# Patient Record
Sex: Male | Born: 1945 | Race: White | Hispanic: No | Marital: Married | Smoking: Former smoker
Health system: Southern US, Community
[De-identification: ages and names within clinical notes are randomized; demographics above are authoritative.]

## PROBLEM LIST (undated history)

## (undated) DIAGNOSIS — F32A Depression, unspecified: Secondary | ICD-10-CM

## (undated) DIAGNOSIS — I25811 Atherosclerosis of native coronary artery of transplanted heart without angina pectoris: Secondary | ICD-10-CM

## (undated) DIAGNOSIS — I4891 Unspecified atrial fibrillation: Secondary | ICD-10-CM

## (undated) DIAGNOSIS — G4700 Insomnia, unspecified: Secondary | ICD-10-CM

## (undated) DIAGNOSIS — C4490 Unspecified malignant neoplasm of skin, unspecified: Secondary | ICD-10-CM

## (undated) DIAGNOSIS — N183 Chronic kidney disease, stage 3 unspecified: Secondary | ICD-10-CM

## (undated) DIAGNOSIS — F329 Major depressive disorder, single episode, unspecified: Secondary | ICD-10-CM

## (undated) DIAGNOSIS — I509 Heart failure, unspecified: Secondary | ICD-10-CM

## (undated) DIAGNOSIS — F419 Anxiety disorder, unspecified: Secondary | ICD-10-CM

## (undated) DIAGNOSIS — Z941 Heart transplant status: Secondary | ICD-10-CM

## (undated) DIAGNOSIS — I10 Essential (primary) hypertension: Secondary | ICD-10-CM

## (undated) DIAGNOSIS — I639 Cerebral infarction, unspecified: Secondary | ICD-10-CM

## (undated) DIAGNOSIS — E785 Hyperlipidemia, unspecified: Secondary | ICD-10-CM

## (undated) DIAGNOSIS — L0390 Cellulitis, unspecified: Secondary | ICD-10-CM

## (undated) HISTORY — DX: Anxiety disorder, unspecified: F41.9

## (undated) HISTORY — DX: Major depressive disorder, single episode, unspecified: F32.9

## (undated) HISTORY — DX: Cellulitis, unspecified: L03.90

## (undated) HISTORY — DX: Essential (primary) hypertension: I10

## (undated) HISTORY — DX: Depression, unspecified: F32.A

---

## 2000-11-17 ENCOUNTER — Encounter: Payer: Self-pay | Admitting: Internal Medicine

## 2000-11-17 ENCOUNTER — Ambulatory Visit (HOSPITAL_COMMUNITY): Admission: RE | Admit: 2000-11-17 | Discharge: 2000-11-17 | Payer: Self-pay | Admitting: Internal Medicine

## 2001-04-25 ENCOUNTER — Ambulatory Visit (HOSPITAL_COMMUNITY): Admission: RE | Admit: 2001-04-25 | Discharge: 2001-04-27 | Payer: Self-pay | Admitting: Internal Medicine

## 2001-04-25 ENCOUNTER — Encounter: Payer: Self-pay | Admitting: Internal Medicine

## 2001-04-27 ENCOUNTER — Encounter: Payer: Self-pay | Admitting: Internal Medicine

## 2001-06-20 ENCOUNTER — Encounter (HOSPITAL_COMMUNITY): Admission: RE | Admit: 2001-06-20 | Discharge: 2001-09-18 | Payer: Self-pay | Admitting: Cardiovascular Disease

## 2002-10-03 ENCOUNTER — Inpatient Hospital Stay (HOSPITAL_COMMUNITY): Admission: EM | Admit: 2002-10-03 | Discharge: 2002-10-07 | Payer: Self-pay | Admitting: Orthopedic Surgery

## 2002-10-04 ENCOUNTER — Encounter: Payer: Self-pay | Admitting: Orthopedic Surgery

## 2003-08-30 ENCOUNTER — Ambulatory Visit (HOSPITAL_COMMUNITY): Admission: RE | Admit: 2003-08-30 | Discharge: 2003-08-30 | Payer: Self-pay | Admitting: Gastroenterology

## 2003-10-18 ENCOUNTER — Emergency Department (HOSPITAL_COMMUNITY): Admission: EM | Admit: 2003-10-18 | Discharge: 2003-10-18 | Payer: Self-pay | Admitting: Emergency Medicine

## 2005-09-22 ENCOUNTER — Ambulatory Visit (HOSPITAL_COMMUNITY): Admission: RE | Admit: 2005-09-22 | Discharge: 2005-09-22 | Payer: Self-pay | Admitting: Specialist

## 2005-11-04 ENCOUNTER — Ambulatory Visit (HOSPITAL_COMMUNITY): Admission: RE | Admit: 2005-11-04 | Discharge: 2005-11-04 | Payer: Self-pay | Admitting: Specialist

## 2006-04-26 HISTORY — PX: KNEE SURGERY: SHX244

## 2006-09-22 ENCOUNTER — Encounter: Admission: RE | Admit: 2006-09-22 | Discharge: 2006-09-22 | Payer: Self-pay | Admitting: Cardiovascular Disease

## 2006-09-26 ENCOUNTER — Ambulatory Visit (HOSPITAL_COMMUNITY): Admission: RE | Admit: 2006-09-26 | Discharge: 2006-09-26 | Payer: Self-pay | Admitting: Cardiovascular Disease

## 2006-09-28 ENCOUNTER — Encounter (INDEPENDENT_AMBULATORY_CARE_PROVIDER_SITE_OTHER): Payer: Self-pay | Admitting: Pediatrics

## 2006-09-28 ENCOUNTER — Inpatient Hospital Stay (HOSPITAL_COMMUNITY): Admission: EM | Admit: 2006-09-28 | Discharge: 2006-09-30 | Payer: Self-pay | Admitting: *Deleted

## 2006-09-29 ENCOUNTER — Encounter (INDEPENDENT_AMBULATORY_CARE_PROVIDER_SITE_OTHER): Payer: Self-pay | Admitting: Pediatrics

## 2007-01-26 ENCOUNTER — Ambulatory Visit (HOSPITAL_COMMUNITY): Admission: RE | Admit: 2007-01-26 | Discharge: 2007-01-27 | Payer: Self-pay | Admitting: *Deleted

## 2007-04-17 ENCOUNTER — Ambulatory Visit (HOSPITAL_COMMUNITY): Admission: RE | Admit: 2007-04-17 | Discharge: 2007-04-17 | Payer: Self-pay | Admitting: *Deleted

## 2007-04-17 ENCOUNTER — Encounter (INDEPENDENT_AMBULATORY_CARE_PROVIDER_SITE_OTHER): Payer: Self-pay | Admitting: *Deleted

## 2007-04-27 HISTORY — PX: EYE SURGERY: SHX253

## 2007-05-31 ENCOUNTER — Encounter (INDEPENDENT_AMBULATORY_CARE_PROVIDER_SITE_OTHER): Payer: Self-pay | Admitting: *Deleted

## 2007-05-31 ENCOUNTER — Ambulatory Visit (HOSPITAL_COMMUNITY): Admission: AD | Admit: 2007-05-31 | Discharge: 2007-06-02 | Payer: Self-pay | Admitting: *Deleted

## 2007-12-14 ENCOUNTER — Encounter (HOSPITAL_COMMUNITY): Admission: RE | Admit: 2007-12-14 | Discharge: 2008-03-13 | Payer: Self-pay | Admitting: Cardiovascular Disease

## 2008-02-13 ENCOUNTER — Ambulatory Visit (HOSPITAL_COMMUNITY): Admission: RE | Admit: 2008-02-13 | Discharge: 2008-02-13 | Payer: Self-pay | Admitting: Internal Medicine

## 2008-07-27 IMAGING — CR DG CHEST 2V
2 series · 2 of 2 positions shown · non-contrast
Comparison: 11/04/2005

CLINICAL DATA: Cough. Preprocedure evaluation for cardioversion.

[w chest pa]
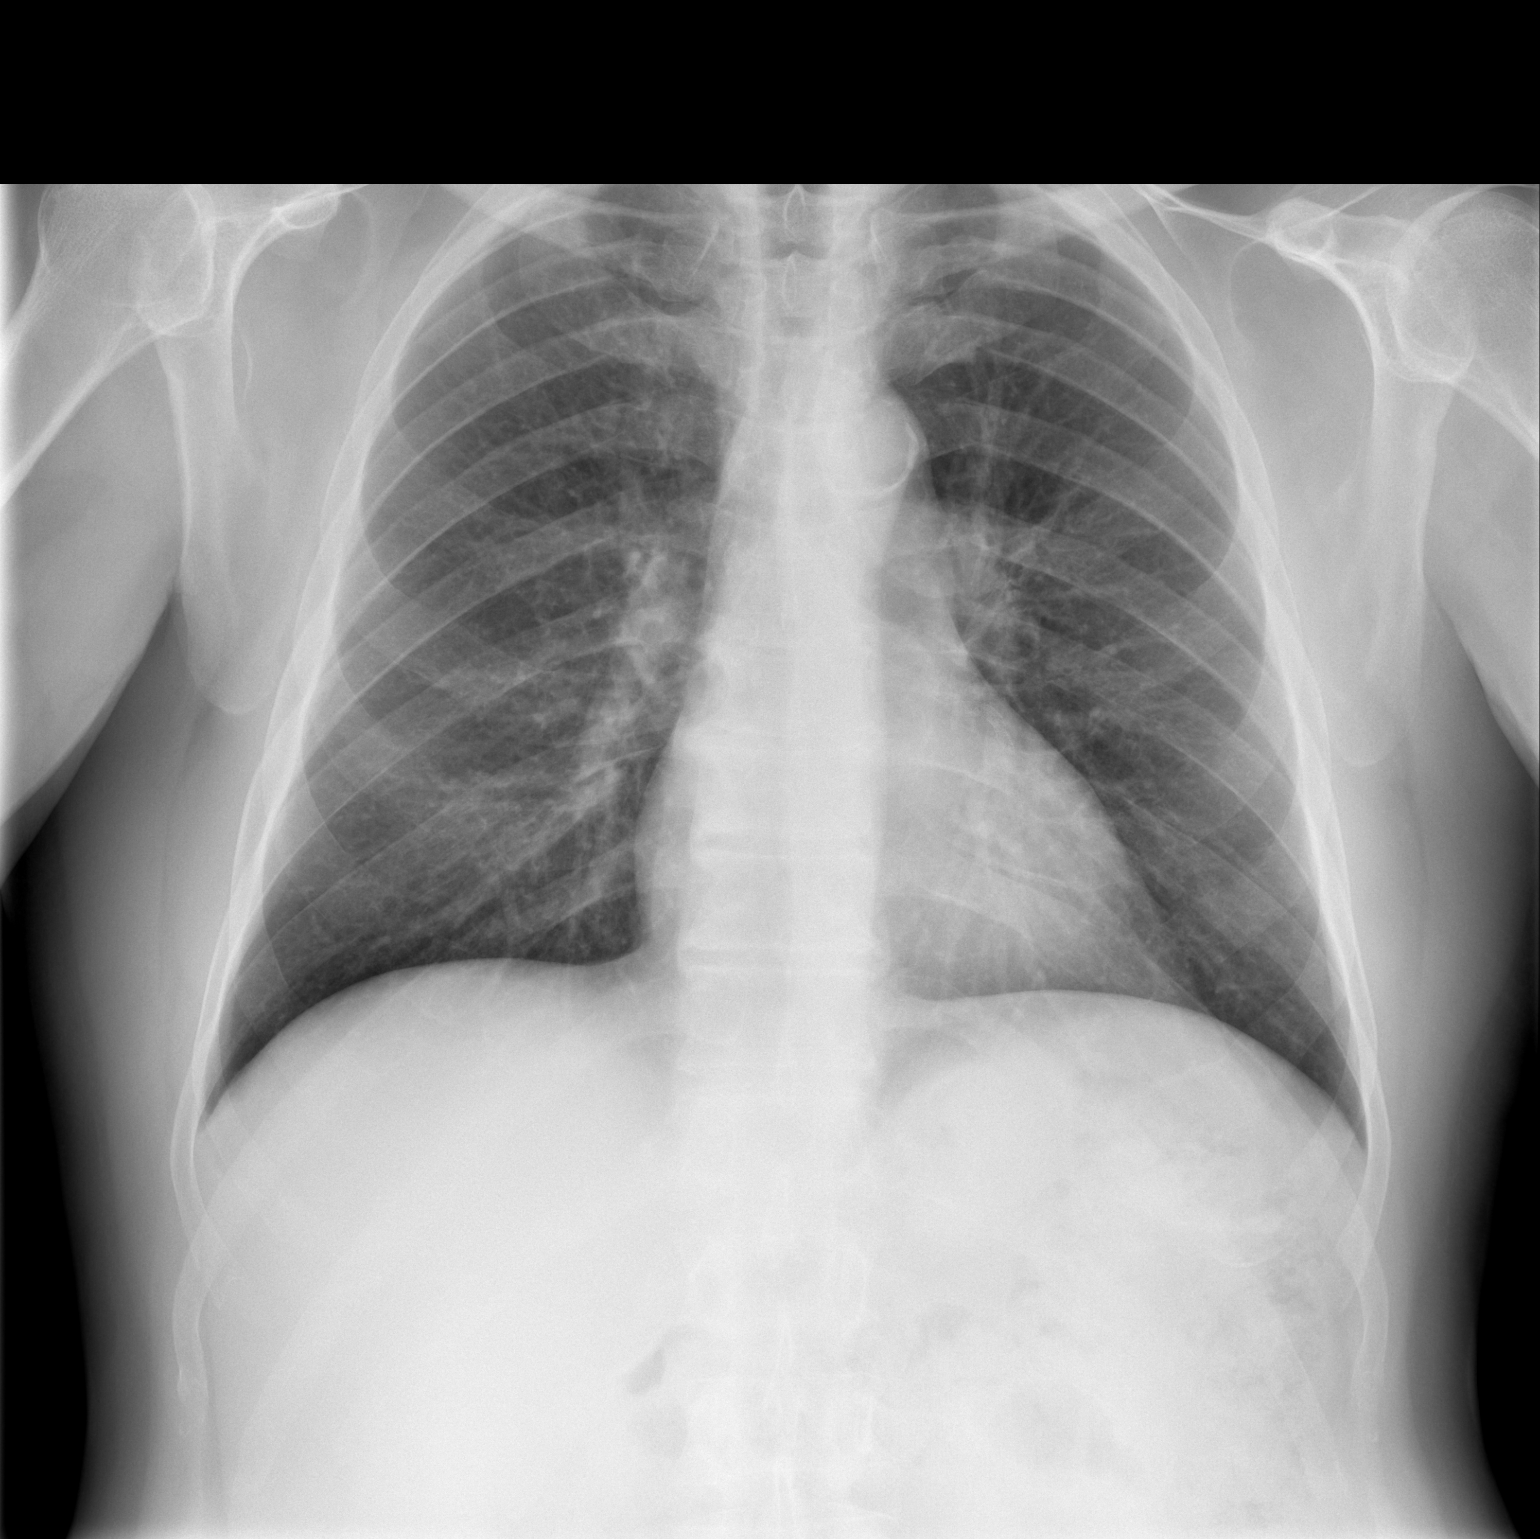

[w chest lat]
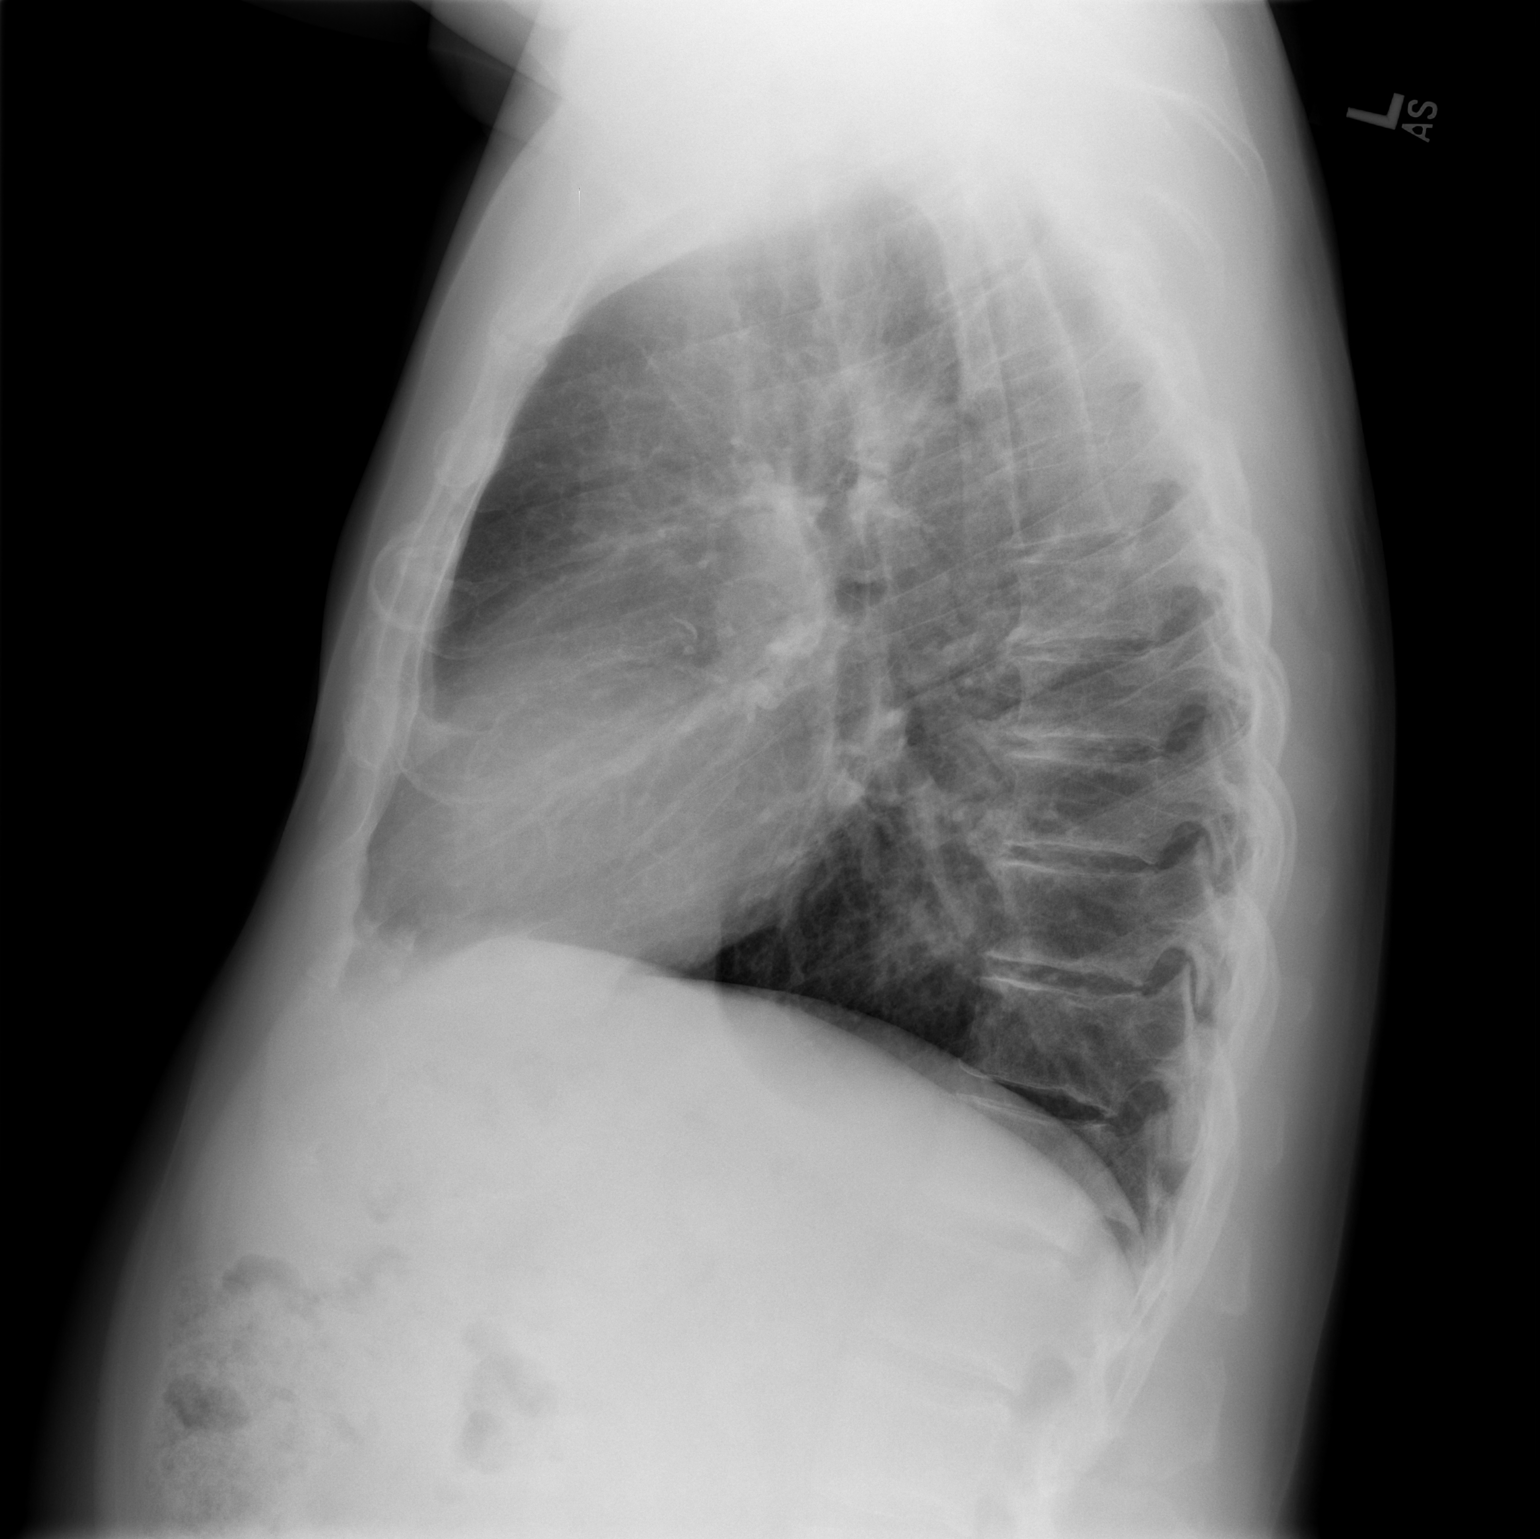

[2 of 2 positions shown; findings below may reference images not displayed]

CHEST - 2 VIEW:

The lungs are clear without focal infiltrate, edema or pleural effusion.
Interstitial markings are diffusely coarsened. The cardiopericardial silhouette
is within normal limits for size. Imaged bony structures of the thorax are
intact.
IMPRESSION: No acute cardiopulmonary process

## 2008-09-19 ENCOUNTER — Ambulatory Visit (HOSPITAL_COMMUNITY): Admission: RE | Admit: 2008-09-19 | Discharge: 2008-09-19 | Payer: Self-pay | Admitting: Internal Medicine

## 2009-03-05 ENCOUNTER — Inpatient Hospital Stay (HOSPITAL_COMMUNITY): Admission: EM | Admit: 2009-03-05 | Discharge: 2009-03-11 | Payer: Self-pay | Admitting: Emergency Medicine

## 2009-03-06 ENCOUNTER — Encounter (INDEPENDENT_AMBULATORY_CARE_PROVIDER_SITE_OTHER): Payer: Self-pay | Admitting: Cardiology

## 2009-07-02 ENCOUNTER — Encounter: Admission: RE | Admit: 2009-07-02 | Discharge: 2009-07-02 | Payer: Self-pay | Admitting: Gastroenterology

## 2009-08-18 ENCOUNTER — Inpatient Hospital Stay (HOSPITAL_COMMUNITY): Admission: EM | Admit: 2009-08-18 | Discharge: 2009-08-19 | Payer: Self-pay | Admitting: Emergency Medicine

## 2010-04-26 HISTORY — PX: HEART TRANSPLANT: SHX268

## 2010-07-14 LAB — COMPREHENSIVE METABOLIC PANEL
ALT: 29 U/L (ref 0–53)
AST: 50 U/L — ABNORMAL HIGH (ref 0–37)
Albumin: 3.8 g/dL (ref 3.5–5.2)
Alkaline Phosphatase: 38 U/L — ABNORMAL LOW (ref 39–117)
BUN: 19 mg/dL (ref 6–23)
Calcium: 9 mg/dL (ref 8.4–10.5)
Creatinine, Ser: 1.72 mg/dL — ABNORMAL HIGH (ref 0.4–1.5)
GFR calc Af Amer: 49 mL/min — ABNORMAL LOW (ref >60)
Glucose, Bld: 97 mg/dL (ref 70–99)
Potassium: 3.9 mEq/L (ref 3.5–5.1)
Total Protein: 7.1 g/dL (ref 6.0–8.3)

## 2010-07-14 LAB — DIFFERENTIAL
Eosinophils Absolute: 0.3 10*3/uL (ref 0.0–0.7)
Lymphs Abs: 2.2 10*3/uL (ref 0.7–4.0)
Monocytes Absolute: 0.5 10*3/uL (ref 0.1–1.0)
Neutro Abs: 4.4 10*3/uL (ref 1.7–7.7)
Neutrophils Relative %: 59 % (ref 43–77)

## 2010-07-14 LAB — PROTIME-INR
INR: 0.99 (ref 0.00–1.49)
INR: 1.4 (ref 0.00–1.49)
Prothrombin Time: 17 seconds — ABNORMAL HIGH (ref 11.6–15.2)

## 2010-07-14 LAB — CK TOTAL AND CKMB (NOT AT ARMC)
CK, MB: 2.1 ng/mL (ref 0.3–4.0)
CK, MB: 2.3 ng/mL (ref 0.3–4.0)
Relative Index: INVALID (ref 0.0–2.5)
Total CK: 107 U/L (ref 7–232)
Total CK: 77 U/L (ref 7–232)

## 2010-07-14 LAB — CBC
MCHC: 34.7 g/dL (ref 30.0–36.0)
Platelets: 230 10*3/uL (ref 150–400)
RBC: 4.77 MIL/uL (ref 4.22–5.81)
RDW: 16.8 % — ABNORMAL HIGH (ref 11.5–15.5)
WBC: 7.5 10*3/uL (ref 4.0–10.5)

## 2010-07-14 LAB — CARDIAC PANEL(CRET KIN+CKTOT+MB+TROPI)
CK, MB: 1.8 ng/mL (ref 0.3–4.0)
Relative Index: INVALID (ref 0.0–2.5)
Total CK: 79 U/L (ref 7–232)

## 2010-07-14 LAB — LIPID PANEL
Cholesterol: 171 mg/dL (ref 0–200)
Cholesterol: 186 mg/dL (ref 0–200)
HDL: 39 mg/dL — ABNORMAL LOW (ref >39)
LDL Cholesterol: 107 mg/dL — ABNORMAL HIGH (ref 0–99)
Triglycerides: 200 mg/dL — ABNORMAL HIGH (ref <150)
Triglycerides: 209 mg/dL — ABNORMAL HIGH (ref <150)

## 2010-07-14 LAB — URINALYSIS, ROUTINE W REFLEX MICROSCOPIC
Bilirubin Urine: NEGATIVE
Hgb urine dipstick: NEGATIVE
Nitrite: NEGATIVE
Protein, ur: NEGATIVE mg/dL
Specific Gravity, Urine: 1.011 (ref 1.005–1.030)
Urobilinogen, UA: 0.2 mg/dL (ref 0.0–1.0)

## 2010-07-14 LAB — BASIC METABOLIC PANEL
Calcium: 8.8 mg/dL (ref 8.4–10.5)
GFR calc non Af Amer: 42 mL/min — ABNORMAL LOW (ref >60)
Glucose, Bld: 82 mg/dL (ref 70–99)
Sodium: 142 mEq/L (ref 135–145)

## 2010-07-14 LAB — TROPONIN I
Troponin I: 0.03 ng/mL (ref 0.00–0.06)
Troponin I: 0.04 ng/mL (ref 0.00–0.06)
Troponin I: 0.06 ng/mL (ref 0.00–0.06)

## 2010-07-14 LAB — RAPID URINE DRUG SCREEN, HOSP PERFORMED
Amphetamines: NOT DETECTED
Barbiturates: NOT DETECTED
Benzodiazepines: POSITIVE — AB
Opiates: NOT DETECTED
Tetrahydrocannabinol: NOT DETECTED

## 2010-07-14 LAB — APTT: aPTT: 24 seconds (ref 24–37)

## 2010-07-14 LAB — VITAMIN B12: Vitamin B-12: 768 pg/mL (ref 211–911)

## 2010-07-29 LAB — CBC
HCT: 46.6 % (ref 39.0–52.0)
HCT: 46 % (ref 39.0–52.0)
HCT: 46.9 % (ref 39.0–52.0)
HCT: 51.6 % (ref 39.0–52.0)
HCT: 54.6 % — ABNORMAL HIGH (ref 39.0–52.0)
Hemoglobin: 15.9 g/dL (ref 13.0–17.0)
Hemoglobin: 15.7 g/dL (ref 13.0–17.0)
Hemoglobin: 16 g/dL (ref 13.0–17.0)
Hemoglobin: 17.4 g/dL — ABNORMAL HIGH (ref 13.0–17.0)
Hemoglobin: 18.8 g/dL — ABNORMAL HIGH (ref 13.0–17.0)
MCHC: 34.1 g/dL (ref 30.0–36.0)
MCHC: 34.2 g/dL (ref 30.0–36.0)
MCHC: 34.3 g/dL (ref 30.0–36.0)
MCV: 99 fL (ref 78.0–100.0)
MCV: 97.6 fL (ref 78.0–100.0)
MCV: 98.8 fL (ref 78.0–100.0)
MCV: 99 fL (ref 78.0–100.0)
MCV: 99.6 fL (ref 78.0–100.0)
Platelets: 164 K/uL (ref 150–400)
Platelets: 171 10*3/uL (ref 150–400)
Platelets: 191 K/uL (ref 150–400)
RBC: 4.71 MIL/uL (ref 4.22–5.81)
RBC: 4.75 MIL/uL (ref 4.22–5.81)
RBC: 5.59 MIL/uL (ref 4.22–5.81)
RDW: 14.7 % (ref 11.5–15.5)
RDW: 14.4 % (ref 11.5–15.5)
RDW: 14.7 % (ref 11.5–15.5)
RDW: 15 % (ref 11.5–15.5)
WBC: 8.4 K/uL (ref 4.0–10.5)
WBC: 8.3 K/uL (ref 4.0–10.5)

## 2010-07-29 LAB — HEMOGLOBIN A1C
Hgb A1c MFr Bld: 5.6 % (ref 4.6–6.1)
Mean Plasma Glucose: 114 mg/dL

## 2010-07-29 LAB — URINALYSIS, ROUTINE W REFLEX MICROSCOPIC
Bilirubin Urine: NEGATIVE
Glucose, UA: NEGATIVE mg/dL
Hgb urine dipstick: NEGATIVE
Ketones, ur: NEGATIVE mg/dL
Nitrite: NEGATIVE
Protein, ur: NEGATIVE mg/dL
Specific Gravity, Urine: 1.017 (ref 1.005–1.030)
Urobilinogen, UA: 0.2 mg/dL (ref 0.0–1.0)
pH: 5.5 (ref 5.0–8.0)

## 2010-07-29 LAB — HEPATIC FUNCTION PANEL
AST: 55 U/L — ABNORMAL HIGH (ref 0–37)
Albumin: 4.6 g/dL (ref 3.5–5.2)
Alkaline Phosphatase: 45 U/L (ref 39–117)
Bilirubin, Direct: 0.1 mg/dL (ref 0.0–0.3)
Indirect Bilirubin: 0.3 mg/dL (ref 0.3–0.9)
Indirect Bilirubin: 1 mg/dL — ABNORMAL HIGH (ref 0.3–0.9)
Total Bilirubin: 0.4 mg/dL (ref 0.3–1.2)
Total Protein: 7.9 g/dL (ref 6.0–8.3)

## 2010-07-29 LAB — DIFFERENTIAL
Basophils Absolute: 0.1 10*3/uL (ref 0.0–0.1)
Basophils Relative: 1 % (ref 0–1)
Eosinophils Absolute: 0.2 10*3/uL (ref 0.0–0.7)
Eosinophils Relative: 2 % (ref 0–5)
Lymphocytes Relative: 24 % (ref 12–46)
Lymphs Abs: 2 10*3/uL (ref 0.7–4.0)
Monocytes Absolute: 0.9 K/uL (ref 0.1–1.0)
Monocytes Relative: 11 % (ref 3–12)
Neutro Abs: 5.2 K/uL (ref 1.7–7.7)
Neutrophils Relative %: 62 % (ref 43–77)

## 2010-07-29 LAB — BASIC METABOLIC PANEL
BUN: 28 mg/dL — ABNORMAL HIGH (ref 6–23)
BUN: 36 mg/dL — ABNORMAL HIGH (ref 6–23)
BUN: 51 mg/dL — ABNORMAL HIGH (ref 6–23)
CO2: 31 mEq/L (ref 19–32)
CO2: 33 mEq/L — ABNORMAL HIGH (ref 19–32)
Calcium: 9.5 mg/dL (ref 8.4–10.5)
Chloride: 100 mEq/L (ref 96–112)
Chloride: 90 mEq/L — ABNORMAL LOW (ref 96–112)
Chloride: 92 mEq/L — ABNORMAL LOW (ref 96–112)
Chloride: 97 mEq/L (ref 96–112)
Creatinine, Ser: 2.09 mg/dL — ABNORMAL HIGH (ref 0.4–1.5)
Creatinine, Ser: 2.1 mg/dL — ABNORMAL HIGH (ref 0.4–1.5)
Creatinine, Ser: 3.32 mg/dL — ABNORMAL HIGH (ref 0.4–1.5)
GFR calc Af Amer: 39 mL/min — ABNORMAL LOW (ref >60)
GFR calc Af Amer: 39 mL/min — ABNORMAL LOW (ref >60)
GFR calc non Af Amer: 19 mL/min — ABNORMAL LOW (ref >60)
GFR calc non Af Amer: 32 mL/min — ABNORMAL LOW (ref >60)
GFR calc non Af Amer: 32 mL/min — ABNORMAL LOW (ref >60)
Glucose, Bld: 104 mg/dL — ABNORMAL HIGH (ref 70–99)
Glucose, Bld: 113 mg/dL — ABNORMAL HIGH (ref 70–99)
Glucose, Bld: 116 mg/dL — ABNORMAL HIGH (ref 70–99)
Glucose, Bld: 98 mg/dL (ref 70–99)
Potassium: 3 mEq/L — ABNORMAL LOW (ref 3.5–5.1)
Potassium: 3 mEq/L — ABNORMAL LOW (ref 3.5–5.1)
Potassium: 3.5 mEq/L (ref 3.5–5.1)
Potassium: 3.8 mEq/L (ref 3.5–5.1)
Sodium: 136 mEq/L (ref 135–145)
Sodium: 137 mEq/L (ref 135–145)

## 2010-07-29 LAB — COMPREHENSIVE METABOLIC PANEL
CO2: 32 mEq/L (ref 19–32)
Calcium: 9.7 mg/dL (ref 8.4–10.5)
Creatinine, Ser: 3.3 mg/dL — ABNORMAL HIGH (ref 0.4–1.5)
GFR calc non Af Amer: 19 mL/min — ABNORMAL LOW (ref >60)
Glucose, Bld: 106 mg/dL — ABNORMAL HIGH (ref 70–99)
Total Bilirubin: 0.9 mg/dL (ref 0.3–1.2)

## 2010-07-29 LAB — BRAIN NATRIURETIC PEPTIDE
Pro B Natriuretic peptide (BNP): 231 pg/mL — ABNORMAL HIGH (ref 0.0–100.0)
Pro B Natriuretic peptide (BNP): 120 pg/mL — ABNORMAL HIGH (ref 0.0–100.0)
Pro B Natriuretic peptide (BNP): 55 pg/mL (ref 0.0–100.0)

## 2010-07-29 LAB — CK TOTAL AND CKMB (NOT AT ARMC)
CK, MB: 2 ng/mL (ref 0.3–4.0)
CK, MB: 2.7 ng/mL (ref 0.3–4.0)
Relative Index: 1 (ref 0.0–2.5)
Total CK: 175 U/L (ref 7–232)
Total CK: 268 U/L — ABNORMAL HIGH (ref 7–232)

## 2010-07-29 LAB — PROTIME-INR
INR: 3.05 — ABNORMAL HIGH (ref 0.00–1.49)
INR: 2.39 — ABNORMAL HIGH (ref 0.00–1.49)
INR: 2.66 — ABNORMAL HIGH (ref 0.00–1.49)
Prothrombin Time: 31.3 s — ABNORMAL HIGH (ref 11.6–15.2)
Prothrombin Time: 35.6 seconds — ABNORMAL HIGH (ref 11.6–15.2)

## 2010-07-29 LAB — BASIC METABOLIC PANEL WITH GFR
BUN: 26 mg/dL — ABNORMAL HIGH (ref 6–23)
CO2: 29 meq/L (ref 19–32)
Calcium: 8.9 mg/dL (ref 8.4–10.5)
Chloride: 98 meq/L (ref 96–112)
Creatinine, Ser: 2.05 mg/dL — ABNORMAL HIGH (ref 0.4–1.5)
GFR calc non Af Amer: 33 mL/min — ABNORMAL LOW
Glucose, Bld: 96 mg/dL (ref 70–99)
Potassium: 3.2 meq/L — ABNORMAL LOW (ref 3.5–5.1)
Sodium: 135 meq/L (ref 135–145)

## 2010-07-29 LAB — MAGNESIUM: Magnesium: 2.9 mg/dL — ABNORMAL HIGH (ref 1.5–2.5)

## 2010-07-29 LAB — COMPREHENSIVE METABOLIC PANEL WITH GFR
ALT: 45 U/L (ref 0–53)
AST: 69 U/L — ABNORMAL HIGH (ref 0–37)
Albumin: 4.7 g/dL (ref 3.5–5.2)
Alkaline Phosphatase: 51 U/L (ref 39–117)
BUN: 48 mg/dL — ABNORMAL HIGH (ref 6–23)
Chloride: 81 meq/L — ABNORMAL LOW (ref 96–112)
GFR calc Af Amer: 23 mL/min — ABNORMAL LOW (ref >60)
Potassium: 3.2 meq/L — ABNORMAL LOW (ref 3.5–5.1)
Sodium: 128 meq/L — ABNORMAL LOW (ref 135–145)
Total Protein: 8.5 g/dL — ABNORMAL HIGH (ref 6.0–8.3)

## 2010-07-29 LAB — DIGOXIN LEVEL: Digoxin Level: <0.2 ng/mL — ABNORMAL LOW (ref 0.8–2.0)

## 2010-07-29 LAB — CARDIAC PANEL(CRET KIN+CKTOT+MB+TROPI)
CK, MB: 2.2 ng/mL (ref 0.3–4.0)
Relative Index: 0.9 (ref 0.0–2.5)
Relative Index: 1 (ref 0.0–2.5)
Troponin I: 0.04 ng/mL (ref 0.00–0.06)

## 2010-07-29 LAB — POTASSIUM: Potassium: 3.7 meq/L (ref 3.5–5.1)

## 2010-07-29 LAB — POCT CARDIAC MARKERS: Myoglobin, poc: 167 ng/mL (ref 12–200)

## 2010-07-29 LAB — TSH: TSH: 3.51 u[IU]/mL (ref 0.350–4.500)

## 2010-07-29 LAB — TROPONIN I: Troponin I: 0.04 ng/mL (ref 0.00–0.06)

## 2010-09-08 NOTE — Discharge Summary (Signed)
Adam Waters, Adam Waters NO.:  [id number]   MEDICAL RECORD NO.:  [id number]          PATIENT TYPE:  INP   LOCATION:  3019                         FACILITY:  [facility]   PHYSICIAN:  Adam P. Pearlean Brownie, Waters    DATE OF BIRTH:  1945/11/06   DATE OF ADMISSION:  09/28/2006  DATE OF DISCHARGE:  09/30/2006                               DISCHARGE SUMMARY   DIAGNOSES AT TIME OF DISCHARGE:  1. 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  2. Atrial fibrillation, on chronic Coumadin.  3. Dyslipidemia.  4. Severe coronary artery disease.  5. History of inferior wall myocardial infarction.  6. Congestive heart failure.  7. Ischemic cardiomyopathy, with ejection fraction of 20%.  8. Hypertension.  9. Non-insulin-dependent diabetes.  10.Repair of left meniscal tear from a crush injury after falling from      his horse.   MEDICATIONS AT TIME OF DISCHARGE:  1. Protonix once a day.  2. Metoprolol 25 mg XL a day.  3. Aspirin 81 mg a day.  4. TriCor 145 mg a day.  5. Lipitor 80 mg a day.  6. Valtrex 1 g a day.  7. Coumadin 7.5 mg the day of discharge, then 5 mg daily.  8. Klor-Con 10 mEq a day.  9. Zetia 10 mg a day.  10.Lasix 20 mg a day.  11.Aldactone 12.5 mg a day.  12.Altace 2.5 mg a day.   STUDIES PERFORMED:  1. CT of the brain on admission shows no acute abnormalities.  2. CT angio on admission showed bilateral calcified plaques without      significant stenosis by NASCET criteria.  3. CT of the head shows focal filling defect distal right M1 segment,      compatible with thrombus.  It appears nonocclusive, though it is      conceivable, but there is filling through collateral method.      Atherosclerotic calcification of cavernous carotids bilaterally.      No other focal stenosis or embolic disease.  4. Cerebral angiogram shows nonocclusive distal right M1 embolus.      This does not  significantly slow flow into the MCA branch vessel,      although all branches appear to fill.  No other significant      occlusive disease.  Calcified atherosclerotic plaque at the      proximal right internal carotid artery without significant stenosis      by NASCET criteria.  5. MRI of the brain shows a right basal ganglia infarct.  MRA with      partial nonocclusive clot right MCA bifurcation.  6. EKG shows undetermined rhythm, low-voltage QRS, nonspecific ST and      T-wave abnormalities.  7. A 2D echocardiogram shows EF of 20%, no embolic source.  Carotid      Doppler:  No ICA stenosis, right ICA with moderate to severe mixed      plaque.   LABORATORY STUDIES:  CBC normal.  Chemistry with glucose 117, otherwise  normal.  Coagulation  studies:  INR 2.2 day of discharge.  Liver function  tests normal.  Albumin 3.2.  Cardiac enzymes with CK-MB 3.7, 4.5, 5.8,  CK 222, 64, 319, troponin I 0.04, 0.05 0.06.  Cholesterol 141,  triglycerides 124, HDL 29, LDL 87, calcium 8.8, magnesium is 2.  Urinalysis showed 0-2 white blood cells, 11-20 red blood cells, and  small leukocyte esterase.  BNP is 252.  Homocystine 13.4, hemoglobin A1c  6.2.  Urine drug screen positive for benzodiazepine and barbiturates.  TSH 2.319.   HISTORY OF PRESENT ILLNESS:  Adam Waters is a 65 year old right-handed  Caucasian male who had sudden onset of left facial, left arm, and left  leg weakness.  He was last known to be normal at 3:10 p.m..  He was seen  simultaneous with onset of his symptoms by his wife and was transported  [facility] by EMS.  The patient was  cardioverted by Adam Waters on September 26, 2006.  In the emergency  room, he had significant left hemiparesis, and a right and middle  cerebral artery distribution infarct was suspected.  He was taken for a  CT angiogram, which showed occlusion of M1.  The patient was not a TPA  candidate secondary to elevated INR, on  Coumadin.  He was then taken to  the angiogram suite for a possible intervention.  Cerebral angiogram  showed some flow past the occlusion.  Therefore, intervention was not  performed.  The patient was put in the ICU for further evaluation.   HOSPITAL COURSE:  MRI was positive for small right basal ganglia infarct  with left hemiparesis.  His hemiparesis has improved significantly  during the hospitalization from 0/5 to now 3-4/5, and he has almost no  leg weakness.  He is able to walk with therapy in the hall, but they  recommend outpatient followup.  Adam Waters was consulted for this  patient, who made multiple medication adjustments.  The patient  definitely needs ongoing risk factor control.  He has been advised to  stop smoking.  His Lipitor was increased to 80 mg, as well as cardiac  medicine adjustments.  He will be discharged home with his family and  will go for outpatient PT and OT.   CONDITION AT DISCHARGE:  The patient alert and oriented x3.  No aphasia.  He does have some mild dysarthria.  He has some left facial weakness.  His eye movements are full.  His visual fields are full.  He has  decreased fine motor movement on the left.  He has mild hemiparesis at  4/5.  He has no lower extremity weakness, though does have some mild  ataxia when walking, and his gait is slightly unsteady.   DISCHARGE PLAN:  1. Discharge home with wife.  2. Aspirin and Coumadin for secondary stroke prevention.  3. Ongoing risk factor control via primary care/cardiology.  4. Follow up PT and magnesium level on Monday at Dr. Kandis Cocking      office.  5. Follow up with Adam Waters as per his recommendation.  6. Follow up with Dr. Pearlean Waters in his office in 2-3 months.      Adam Waters, N.P.    ______________________________  Adam Schlein. Pearlean Brownie, Waters    Adam Waters  D:  09/30/2006  T:  09/30/2006  Job:  [id number]   cc:   Adam Waters

## 2010-09-08 NOTE — Op Note (Signed)
Adam Waters, Adam Waters NO.:  [id number]   MEDICAL RECORD NO.:  [id number]          PATIENT TYPE:  OIB   LOCATION:  2899                         FACILITY:  [facility]   PHYSICIAN:  Richard A. Alanda Amass, M.D.DATE OF BIRTH:  Dec 26, 1945   DATE OF PROCEDURE:  09/26/2006  DATE OF DISCHARGE:                               OPERATIVE REPORT   CARDIOLOGIST:  Gerlene Burdock A. Alanda Amass, M.D.   PROCEDURE:  Direct-current cardioversion.   BRIEF HISTORY:  Mr. Kissoon is a 65 year old white married gentleman with  known coronary artery disease and remote inferior wall myocardial  infarction, with catheterization at another institution.  He has  hyperlipidemia and chronic mild renal insufficiency.  Please refer to  outpatient records and H&P for full history.  He has had persistent  atrial fibrillation over the last several months, and has been  anticoagulated with Coumadin and documented therapeutic anticoagulation.  His INR today on the day of the procedure was 2.5.  He does have mild  hypocalcemia with a K of 3.5, BUN and creatinine of 32/14 on 09/26/06.  Informed consent had been obtained from the patient and his wife to  proceed with elective D-C cardioversion.  We have previously explained  the risks, procedure, indications and alternatives to D-C cardioversion  as an outpatient, and they were once again agreeable to proceed.  The  patient was in a postictal state and was admitted as a same-day  admission and had taken his medications today.  Laboratory is outlined  above.  The EKG showed atrial fibrillation with a controlled ventricular  response.   The patient had developed symptoms of clinical congestive heart failure  with mildly elevated BNP, weight gain and symptoms of dyspnea on  exertion and exercise intolerance associated with his atrial  fibrillation, prompting recommendation for D-C cardioversion.  Antiarrhythmic agents were not added to his regimen, since this is his  first  episode of atrial fibrillation.   OUTPATIENT CARDIAC MEDICATIONS:  Toprol XL 50, 1 Baby Aspirin, Coumadin,  Lipitor 40, Zetia 10, Tricor 145, Lasix 40, potassium 10, and Zaroxolyn  2.5 1 day per week.   DESCRIPTION OF PROCEDURE:  The patient was hemodynamically stable, blood  pressure 110-120, atrial fibrillation with a controlled ventricular  response.  He received 250 mg of IV pentothal anesthesia in the post-  absorptive state by Dr. Gelene Mink from Anesthesia.  He was cardioverted  with a single shock of 120 J, D-C countershock, biphasic with A/P  paddles.  Sinus rhythm was restored with APCs.  He also had 3 to 2  intermittent Wenckebach that was asymptomatic.  The patient will be  monitored.  He awoke without problems with no neurologic sequela and  tolerated the procedure well.   PLAN:  He will be watched carefully for his rhythm, and I plan to see  him back in the office at the end of this week.  In the interim, we will  increase his potassium supplement to 20 b.i.d. KCl, and increase his  Lipitor from 40 to 80.  We will recheck his labs at the end of the  week.  I have discussed all of this with Loring and his wife and they  understand this.  The patient will be allowed to go home later today, if  her remains stable post cardioversion.  We will monitor his clinical  status carefully, and we will probably go ahead and repeat a 2-D echo if  he maintains sinus  rhythm.  EF was 40 to 45% on the last 2-D echo of 07/06/06, and left  atrial dimension was 5 cm, moderately dilated.  Cardiolite of 07/04/06  showed no ischemia, and the patient has not had any clinical angina.  Outpatient renal Dopplers were negative for any renal artery stenosis,  with normal kidney sizes.      Richard A. Alanda Amass, M.D.  Electronically Signed     RAW/MEDQ  D:  09/26/2006  T:  09/26/2006  Job:  [id number]   cc:   Kingsley Callander. Ouida Sills, MD  Susa Griffins, MD

## 2010-09-08 NOTE — Discharge Summary (Signed)
Adam Waters, Adam Waters NO.:  [id number]   MEDICAL RECORD NO.:  [id number]          PATIENT TYPE:  OIB   LOCATION:  2020                         FACILITY:  [facility]   PHYSICIAN:  Richard A. Alanda Amass, M.D.DATE OF BIRTH:  [birth year]   DATE OF ADMISSION:  05/31/2007  DATE OF DISCHARGE:  06/02/2007                               DISCHARGE SUMMARY   Adam Waters is a 65 year old white married male patient of Dr. Susa Griffins and Dr. Jenne Campus and Dr. Sampson Goon, who has had past  cardioversions, and then he had a CVA.  He has been loaded on amiodarone  as an outpatient, now with elective TEE cardioversion.  He has had  spontaneous contrast noted in his LAA in the past, and cardioversion has  not been done.  However, the patient had decided that, regardless of the  spontaneous contrast, he wanted the TEE cardioversion done.  On May 31, 2007, underwent TEE.  He still had moderate spontaneous contrast  noted in his LA and LAA.  His EF is about 20% to 25%.  He underwent  cardioversion by Dr. Jenne Campus, with conversion to sinus, rhythm rate of  60, with one shock at 150 joules, and when he was admitted, he was found  to have an INR 1.8, thus he was given Lovenox injection and he was then  admitted for Lovenox to Coumadin.  He was kept overnight, then several  hours after the cardioversion, he went into a Wenckebach rhythm.  His  Lopressor was discontinued, and also his amiodarone was discontinued  from 400 to 200 mg, and he was watched again overnight.  His INR the  following day after cardioversion was 2.4.  On June 02, 2007, he was  seen by Dr. Susa Griffins.  He still had some marked first degree  heart block, with intermittent type 1 Wenckebach.  He had no significant  bradycardia.  His blood pressure was 126/83.  Dr. Alanda Amass discontinued  his Lopressor and put him instead on Coreg 3.125 twice a day.  It was  felt that he could be discharged home.  Dr. Alanda Amass  discussed with him  a possible bi-V ICD placement in the future.  However, he will wait  until we see if he has any benefit from restoration of sinus rhythm.   DISCHARGE MEDICATIONS:  1. Coreg 3.125 mg twice per day.  2. Altace 5 mg daily.  3. Furosemide 40 mg every day.  4. Tricor 145 mg every day.  5. Spirolactone 12.5 mg daily.  6. Zetia 10 mg daily.  7. Amiodarone 200 mg a day.  8. Coumadin 5 mg on Monday, Wednesday, Friday, and 2.5 mg on other      days.  9. Valtrex 1 gram daily.  10.Aspirin 81 mg a day.  11.Lipitor 80 mg a day.   He should stop taking metoprolol.  He will return to see Dr. Alanda Amass  on June 16, 2007.  He has no activities restrictions.  He should be  on a low sodium heart healthy diet, and he will be seen at the Coumadin  clinic in the week after his discharge.   LABS:  Magnesium 2.2, sodium 138, potassium 4.2, chloride 103, CO2 31,  glucose 92, BUN 14, creatinine 1.76, INR is 2.4.  His creatinine on his  June 01, 2007 was 1.69.  His hemoglobin was 14.7, hematocrit 43.2,  WBCs 8.3, and platelets were 177.  His creatinine as an outpatient was  1.8 on May 25, 2007.   DISCHARGE DIAGNOSES:  1. Status post TEE cardioversion with resumption of sinus rhythm.  2. Paroxysmal atrial fibrillation  3. History of right middle cerebral artery branch cardioembolic      infarcts, secondary to cardioversion in 2008.  4. Ischemic cardiomyopathy with an EF of 20%, in atrial fib.  5. History of inferior wall myocardial infarction.  6. Coronary artery disease with history of inferior wall myocardial      infarction.  7. Hypertension.  8. Non-insulin dependent diabetes mellitus, diet-controlled.  9. Dyslipidemia.      Lezlie Octave, N.P.      Richard A. Alanda Amass, M.D.  Electronically Signed    BB/MEDQ  D:  06/02/2007  T:  06/03/2007  Job:  [id number]   cc:   Darlin Priestly, MD  Dawna Part

## 2010-09-08 NOTE — Discharge Summary (Signed)
NAMEWINTER, TREFZ NO.:  [id number]   MEDICAL RECORD NO.:  [id number]          PATIENT TYPE:  OIB   LOCATION:  2020                         FACILITY:  [facility]   PHYSICIAN:  Richard A. Alanda Amass, M.D.DATE OF BIRTH:  23-Aug-1945   DATE OF ADMISSION:  05/31/2007  DATE OF DISCHARGE:  06/02/2007                               DISCHARGE SUMMARY   ADDENDUM   On previous discharge dictation, it was noted that he had a diagnosis of  NIDDM.  I spoke with the patient and he does not have non-insulin  dependent diabetes, thus this is not a diagnosis for him.  This needs to  be excluded from his list of diagnoses.      Lezlie Octave, N.P.      Richard A. Alanda Amass, M.D.  Electronically Signed    BB/MEDQ  D:  06/02/2007  T:  06/02/2007  Job:  [id number]   cc:   Sampson Goon, M.D.

## 2010-09-08 NOTE — H&P (Signed)
NAMEAHRON, HULBERT NO.:  [id number]   MEDICAL RECORD NO.:  [id number]          PATIENT TYPE:  INP   LOCATION:  3114                         FACILITY:  [facility]   PHYSICIAN:  Deanna Artis. Hickling, M.D.DATE OF BIRTH:  [birth year]   DATE OF ADMISSION:  09/28/2006  DATE OF DISCHARGE:                              HISTORY & PHYSICAL   CHIEF COMPLAINT:  Left-sided weakness.   HISTORY OF PRESENT ILLNESS:  The patient had a sudden onset of left  facial arm and leg weakness.  He was last known normal at 1510 hours.  He was seen simultaneous with the onset of his symptoms by his wife and  was transported to [facility] from [facility] by EMS.  The patient has a history of paroxysmal atrial fibrillation, is on  Coumadin.  He was cardioverted by Dr. Franchot Heidelberg on September 26, 2006.   PAST MEDICAL HISTORY:  Remarkable for:  1. Dyslipidemia.  2. Severe coronary artery disease.  3. He has had an inferior wall myocardial infarction.  4. Prolonged history of atrial fibrillation, hence the attempted      cardioversion.  5. Congestive heart failure in the past.  6. Treated hypertension.  7. History of non-insulin dependent diabetes mellitus that was unknown      to his wife.   PAST SURGICAL HISTORY:  Repair of a left meniscal tear from a crush  injury where he was fallen on by his horse.   CURRENT MEDICATIONS:  1. Valtrex 1 gram daily.  2. Aspirin 81 mg daily.  3. Zetia 10 mg daily.  4. Tricor 145 mg daily.  5. Lipitor 40 mg daily.  6. Protonix 40 mg daily.  7. Potassium chloride 10 mEq daily.  8. Coumadin 5 mg daily.  9. Lopressor 100 mg daily.   ALLERGIES:  NONE KNOWN.   FAMILY HISTORY:  Father died at age 48, he had a myocardial infarction  when he was younger, had prostate cancer and diabetes mellitus.  Mother  died at age 69, she had myocardial infarction, cardiomyopathy,  congestive heart failure, and diabetes mellitus.  His sister has had a  coronary artery bypass graft.  Brother is healthy.  He has 3 children  who are healthy.   SOCIAL HISTORY:  The patient has not been working since March.  He works  on Engineer, manufacturing systems.  He has been married to his wife 6 years, this is  the second marriage.  He had 3 children by his first marriage.  He  smokes one-pack of cigarettes per day and has done so for years,  although he has intermittently quit.  He quit drinking alcohol 3 to 4  months ago.  He does not use drugs.   REVIEW OF SYSTEMS:  NEUROLOGIC COMPLAINTS:  None.  CARDIOVASCULAR:  No  chest pain.  No palpitations.  PULMONARY:  No COPD, chronic cough.  GI:  No ulcers or GI bleed.  GU:  No urinary tract infection.  MUSCULOSKELETAL:  Treated left knee.  ENDOCRINE:  I do not know if he  truly had diabetes mellitus.  The glucose in the discharge summary was  125.  REPRODUCTIVE:  He has mild erectile disfunction.  HEMATOLOGIC:  No  anemia.  ALLERGY:  None.  NEURO/PSYCHIATRIC:  Depression untreated.   PHYSICAL EXAMINATION:  VITAL SIGNS:  Temperature 97.4, blood pressure  140/89, resting pulse 51, respirations 18, oxygen saturation 99%, weight  202 pounds.  HEENT:  No bruits. No infections.  NECK:  Supple.  LUNGS:  Clear.  HEART:  No murmurs.  He has an atrial fibrillation rhythm.  ABDOMEN:  Soft.  Bowel sounds normal.  No hepatosplenomegaly.  SKIN:  No lesions.  NEUROLOGIC EXAMINATION:  The patient was awake.  He was not oriented to  month, but knew his name, knew his location.  Did not have tooth  aphasia.  Cranial nerves:  Round and reactive pupils.  Visual fields  full, but he does extinguish in the left visual field, a double  simultaneous stimuli.  Extraocular movements are full and conjugate.  At  times it seems as he has some difficulty looking to the left in  comparison to the right.  He has definite dense left central 7th, but he  also cannot fully burry his eyelashes.  He has altered sensation on the  left side of his  face and indeed he extinguishes noxious stimuli and  double simultaneous stimuli.  MOTOR EXAMINATION:  Shows minimal movement of the left leg.  No movement  of the left arm, except to noxious stimuli and at that time he has a  very strong  decerebrate response.  Deep tendon reflexes show a slight  reflex predominance on the left side.  He has a left triple flexion  response.  He has evidence of left hemisensory dysfunction.   IMPRESSION:  1. Right middle cerebral artery distribution infarction, hemispheric,      likely cardioembolic source 434.11.  The patient had a CT      angiogram, which showed a distal M2 occlusion.  2. He has chronic atrial fibrillation status post cardioversion June      2, that initially worked, but currently has failed.  3. Hypertension.  4. Dyslipidemia.  5. The patient also has mild azotemia in part is related to being      dehydrated.   LABORATORY DATA:  Sodium 136, potassium 3.4, chloride 99, CO2 36, BUN  34, creatinine 1.8, glucose 101.  White blood cell count 8,600,  hemoglobin 16.1, hematocrit 47.3, platelet count 213,000.  PT 24.2, INR  2.1, PTT 33.   A CT of the brain was negative.   PLAN:  The patient will for catheter angiogram for the [facility] device.  This was discussed with the family and an informed consent was given by  Dr. Marin Roberts.  He will then go to intensive care.  His INR is  2.1, PT 24.2, PTT 33, therefore tPA is not possible and there are  potential complications for catheter angiogram, which is why CT angio  was done prior to that.  We will need to hydrate him well because of his  creatinine.      Deanna Artis. Sharene Skeans, M.D.  Electronically Signed     WHH/MEDQ  D:  09/28/2006  T:  09/29/2006  Job:  [id number]   cc:   Gerlene Burdock A. Alanda Amass, M.D.

## 2010-09-08 NOTE — Cardiovascular Report (Signed)
NAMEDYLON, CORREA NO.:  [id number]   MEDICAL RECORD NO.:  [id number]          PATIENT TYPE:  AMB   LOCATION:  ENDO                         FACILITY:  [facility]   PHYSICIAN:  Adam Priestly, MD  DATE OF BIRTH:  16-Apr-1946   DATE OF PROCEDURE:  04/17/2007  DATE OF DISCHARGE:                            CARDIAC CATHETERIZATION   PROCEDURES:  Transesophageal echocardiogram.   ATTENDING PHYSICIAN:  Adam Priestly, MD   COMPLICATIONS:  None.   INDICATIONS:  Adam Waters is a 65 year old male patient of Dr. Susa Griffins with a history of dilated cardiomyopathy, history of PAF who  has undergone failed cardioversion with subsequent CVA.  He was  subsequently begun on Coumadin and then begun extensive loading with  amiodarone therapy.  He did undergo a subsequent TEE in attempt to try  to convert back to sinus rhythm, though this revealed a severe amount of  smoke in the left atrium with questionable clot.  He is now referred  back after 6-8 weeks of Coumadin for repeat TEE and  attempt at  cardioversion.   DESCRIPTION:  The patient gave informed consent. The patient was brought  to the endoscopy suite in a fasting state.  The patient underwent  successful uncomplicated transesophageal echocardiogram.   Left ventricle was moderately dilated with severely depressed EF at  proximal 20-25% with global hypokinesis.   Mildly thickened aortic valve with no significant aortic stenosis or  regurgitation.   Mildly thickened mitral valve leaflets and mild mitral regurgitation.   Structurally normal tricuspid valve with mild tricuspid regurgitation.   Small PFO noted with minimal right-to-left and left-to-right shunting.   There is moderately severe amount of spontaneous echo contrast noted in  the left atrium and left atrium appendage with a suspicious area of  possible clot in the left atrial appendage.  Flow is less than at 2  meters.  There is moderate  dilation of both left and right atrium.   Normal descending thoracic aorta.   CONCLUSION:  successful transesophageal echocardiogram with findings  noted above.  Direct current cardioversion was not performed secondary  to severe spontaneous echo contrast with a suspicion for a left atrial  appendage thrombus.      Adam Priestly, MD  Electronically Signed     RHM/MEDQ  D:  04/17/2007  T:  04/17/2007  Job:  [id number]   cc:   Gerlene Burdock A. Alanda Amass, M.D.

## 2010-09-08 NOTE — Cardiovascular Report (Signed)
NAMEHERSEL, MCMEEN NO.:  [id number]   MEDICAL RECORD NO.:  [id number]          PATIENT TYPE:  OIB   LOCATION:  2020                         FACILITY:  [facility]   PHYSICIAN:  Darlin Priestly, MD  DATE OF BIRTH:  10-Aug-1945   DATE OF PROCEDURE:  05/31/2007  DATE OF DISCHARGE:                            CARDIAC CATHETERIZATION   PROCEDURE:  Transesophageal-guided DC cardioversion.   ATTENDING:  Darlin Priestly, MD.   COMPLICATIONS:  None.   INDICATIONS:  Adam Waters is a 65 year old male patient of Dr. Susa Griffins, with a history of nonischemic cardiomyopathy.  He had done  well until he developed atrial fibrillation with a steady decline in his  EF.  He did undergo an attempted outpatient cardioversion to sinus  rhythm, without anti-arrhythmic therapy.  This resulted in recurrent  atrial fibrillation.  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  He has complained of  increasing fatigue and inability to do his activities of daily living.  He has subsequently been loaded on amiodarone as an outpatient, with  multiple plans to repeat his cardioversion.  However, he continues to  have spontaneous echo contrast noted in the left atrium and left atrial  appendage.  We could not exclude thrombus.  He has now been unable to  tolerate his fatigue, and is now requesting to undergo a repeat TEE  cardioversion.   DESCRIPTION OF PROCEDURE:  After getting informed consent, the patient  was brought to the endoscopy suite in a fasting state.  He then  underwent successful and uncomplicated transesophageal echocardiogram.   1. The left ventricle was mild to moderately dilated, with severely      depressed left ventricular systolic function; estimated at      approximately 20-25% with global hypokinesis.  2. The aortic valve was mildly thickened and no evidence of      significant aortic stenosis or regurgitation.  3. The mitral valve leaflets were mildly thickened, with mild mitral      regurgitation.  4. There was obstruction in the tricuspid valve, with moderate      tricuspid regurgitation.  5. Small PFA noted, with left-to-right shunting by color Doppler.  6. Bi-atrial enlargement noted.  There was spontaneous echo contrast      noted in the left atrial appendage.  There was no evidence of      intracardiac mass or thrombus present.  The descending aorta was      not well visualized.   Following the TEE, the patient was then given 50 mg of pentothal by Dr.  Bedelia Person.  He then received one 150 joule biphasic shock, with  restoration to sinus rhythm.  He remained hemodynamically stable.  He  was then transferred to the recovery room in stable condition.   CONCLUSION:  Successful transesophageal echocardiographic-guided  cardioversion, with findings noted above.      Darlin Priestly, MD  Electronically Signed     RHM/MEDQ  D:  05/31/2007  T:  06/01/2007  Job:  [id number]  cc:   Adam Waters, M.D.

## 2010-09-11 NOTE — Op Note (Signed)
NAMEJOIE, REAMER NO.:  [id number]   MEDICAL RECORD NO.:  [id number]          PATIENT TYPE:  AMB   LOCATION:  DAY                          FACILITY:  [facility]   PHYSICIAN:  Jene Every, M.D.    DATE OF BIRTH:  01-28-1946   DATE OF PROCEDURE:  11/04/2005  DATE OF DISCHARGE:                                 OPERATIVE REPORT   PREOPERATIVE DIAGNOSES:  1.  Medial meniscal tear.  2.  Crush injury to the left knee.  3.  Seroma of the left knee, possible quadriceps tendon tear.   POSTOPERATIVE DIAGNOSES:  1.  Medial meniscal tear.  2.  Crush injury to the left knee.  3.  Seroma of the left knee, possible quadriceps tendon tear.  4.  Grade III chondromalacia patella.  5.  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  6.  Radial tear of the medial meniscus.   PROCEDURES PERFORMED:  1.  Diagnostic arthroscopy with chondroplasty of the patella and of the      medial tibial plateau as well as shaving of the medial meniscus.  2.  Evacuation of serohematoma.  3.  Open quadriceps tendon repair.   ANESTHESIA:  General anesthesia.   ASSISTANT:  None.   INDICATIONS FOR PROCEDURE:  This is a 65 year old male who had a horse fall  onto him, predominantly his left knee, sustaining a large hematoma to his  thigh and associated contusion.  Patient had no evidence of fracture and, by  MRI, had a large subcutaneous hematoma dissecting into the subcutaneous  fatty tissue.  He was aspirated on multiple occasions and had persistent  reaccumulation of his fluid, pain in the knee, also a palpable defect,  though MRI indicated no evidence of an obvious tear on his quadriceps  tendon.  Due to persistent pain, the palpable defect, the continued seroma  as well as the intra-articular fusion, he is indicated for diagnostic  arthroscopy.  Possible open quadriceps tendon repair and debridement of the  knee.  Risks and benefits discussed including bleeding, infection, damage  to  vascular structures, no change in symptoms, worsening symptoms, need for  repeat debridement in the future, recurrent tear, etc.   DESCRIPTION OF PROCEDURE:  Patient in supine position after the induction of  adequate general anesthesia and 1 g Kefzol, left lower extremity was prepped  and draped in the usual sterile fashion.  The lateral parapatellar portal  and superior medial parapatellar portal was fashioned with a #11 blade  __________ atraumatically placed.  Irrigant was utilized to insufflate the  joint.  Arthroscopic camera was then inserted.  Inspection revealed a normal  patellofemoral tracking.  The superior medial portal cannula was introduced  and also into the subcutaneous hematoma which evacuated some serobloody  fluid.  There was normal patellofemoral tracking.  I inserted the scope and  examined the quadriceps tendon palpating the quadriceps tendon proximally.  Initially, there was no evidence of tear, however, with the knee brought in  about 60 degrees of flexion approximately 3 cm above the insertion of the  quadriceps tendon, there  was a small rent seen in the quadriceps tendon.  I  placed an 18-gauge needle through the skin into this tear.  This was for  marking.  I then removed it.  I then examined the rest of the knee.  There  was some grade III chondromalacia of the medial tibial plateau, some radial  tearing of the medial meniscus.  I shaved it with a 4.2 barracuda shaver and  performed chondromalacia of tibial plateau of patella, shaving the medial  meniscus.  There was some hypertrophic synovitis that was shaved as well.  The lateral compartment was essentially unremarkable.  Normal meniscus,  stable to probe palpation, no evidence of tear.  Chondral surfaces are  unremarkable.  I then redirected the scope into the suprapatellar pouch and  replaced our localization 18-gauge needle.  A nonsterile tourniquet was  placed proximal underneath the sterile drapes  avoiding the genitals.  I then  raised the leg inflated to 300 mmHg.  I made approximately a 10 mm incision  bisecting the caudad and cephalad from the localization needle.  Subcutaneous tissue was dissected.  Electrocautery was utilized to achieve  hemostasis, down to the area of the palpable defect externally and where the  needle guide was.  Right where the needle was, I saw no obvious tear through  the tendon from the outside,  however, there was this large fibrofatty  tissue extending extensively medially over the top of the tendon to  laterally.  It was scar and fibrous tissue.  There was a separation between  this and the subcutaneous tissue I believe where the subcutaneous tissue  hematoma was.  The edge of this was fibrofatty in nature and was responsible  for the palpable defect which was really not a defect in the tendon but this  edge.  Also medially, it extended down to the medial side of the joint line  and some piercing of superficial nerves were noted to be coursing through  the space.  I made a longitudinal incision over my needle down to the tendon  tear.  This was approximately a centimeter in length and repaired side-to-  side with #1 Vicryl interrupted figure-of-eight sutures in two layers.  I  debrided the edge of this fibrofatty tissue and then I sutured it to the  leading edge down to the retinaculum so that there was no this step off.  I  then flexed the knee to 0 to 90.  I had good excursion without rupturing of  the repair line.  The wound was copiously irrigated as was the seroma pocket  and I used some tacking sutures for this, 2-0, tacked down the area and then  repaired the subcutaneous tissue with 0 and 2-0 Vicryl simple sutures.  The  skin was reapproximated with 4-0 subcuticular Prolene.  Wound reinforced  with Steri-Strips.  Portals were closed with 4-0 nylon simple suture.  Wounds dressed sterilely, secured with a Webril and Ace bandage. The tourniquet,  prior to that, had been released and there was adequate  revascularization of the lower extremity appreciated.  There was half an  hour on the tourniquet.   Patient tolerated the procedure well with no complications.      Jene Every, M.D.  Electronically Signed     JB/MEDQ  D:  11/04/2005  T:  11/04/2005  Job:  [id number]

## 2010-09-11 NOTE — Procedures (Signed)
[facility]  Patient:    Adam Waters, Adam Waters Visit Number: [id number] MRN: [id number]          Service Type: OUT Location: RAD Attending Physician:  Carylon Perches Dictated by:   Carylon Perches, M.D. Admit Date:  04/25/2001                                Stress Test  PROCEDURE:  Cardiolite stress test.  DESCRIPTION OF PROCEDURE:  The patient exercised 7 minutes 16 seconds (1 minute 16 seconds into stage III of the Bruce protocol), attaining a maximal heart rate of 149 (91% of the age-predicted maximal heart rate) at a work load of 10.1 METs and discontinued exercise due to shortness of breath. There were no symptoms of chest pain.  He developed second-degree A-V block during recovery.  There was a hypertensive blood pressure response to exercise, reaching a peak of 200/100.  There were no ST segment changes diagnostic of ischemia.  The baseline EKG revealed normal sinus rhythm with nonspecific inferolateral T wave changes.  IMPRESSION: 1. No evidence of exercise induced ischemia. 2. Cardiolite images pending. 3. Second-degree atrioventricular block during recovery. Dictated by:   Carylon Perches, M.D. Attending Physician:  Carylon Perches DD:  04/25/01 TD:  04/25/01 Job: [id number]

## 2010-09-11 NOTE — Discharge Summary (Signed)
Adam Waters, Adam Waters                           ACCOUNT NO.:  [id number]   MEDICAL RECORD NO.:  [id number]                   PATIENT TYPE:  INP   LOCATION:  0345                                 FACILITY:  [facility]   PHYSICIAN:  Georges Lynch. Darrelyn Hillock, M.D.             DATE OF BIRTH:  1945-10-11   DATE OF ADMISSION:  10/03/2002  DATE OF DISCHARGE:  10/07/2002                                 DISCHARGE SUMMARY   ADMISSION DIAGNOSES:  1. Cellulitis, right elbow.  2. Hypertension.  3. Hypercholesterolemia.  4. Non-insulin dependent diabetes.   DISCHARGE DIAGNOSES:  1. Cellulitis, right elbow, resolving.  2. Hypertension.  3. Hypercholesterolemia.  4. Non-insulin dependent diabetes.   PROCEDURE:  None.   CONSULTATIONS:  Infectious disease.   HISTORY OF PRESENT ILLNESS:  This patient is a 65 year old male who  presented to our office on October 03, 2002, with pain and swelling in his right  elbow.  He had been treated by his primary care physician for cellulitis of  his right elbow.  Was administered IM antibiotics in the office and started  on oral antibiotics, but had seen no improvement.  He became febrile two  days ago, and it was elected that he should see an orthopaedic surgeon for  possible hospital admission for IV antibiotic therapy.   LABORATORY DATA:  Admission CBC showed a white blood cell count of 16, red  blood cells 4.04, hemoglobin 13.7, hematocrit 39.8, platelet count 199.  Neutrophils elevated at 84%.  ESR elevated at 73.  Preadmission chemistries  showed a sodium of 139, potassium 4, CO2 33, elevated glucose at 125,  elevated BUN at 21, creatinine 1.4, calcium 9.3.  C-reactive protein high at  20.4.  Preadmission urinalysis was normal.   HOSPITAL COURSE:  The patient was admitted to [facility] following  an office visit with Dr. Darrelyn Hillock on October 03, 2002.  He was admitted for  cellulitis of his right elbow to receive IV antibiotic therapy.  A PICC line  was  placed at the time of admission so he could be discharged home on IV  antibiotics if needed.  The patient's culture was done by his primary care  physician in the office which grew out Group A Strep.  The patient's  antibiotics were switched from vancomycin to Ancef IV, and he was to be  discharged home on oral Keflex if he responded well to the antibiotics.  The  patient became afebrile by the second day after admission and did not run a  temperature while in the hospital.  Cellulitis responded well to the IV  Ancef, and it was elected that the patient would be discharged home on oral  antibiotics.   DISPOSITION:  The patient was discharged home on October 07, 2002.   DISCHARGE MEDICATIONS:  1. Keflex 500 mg q.i.d. x10 days.  2. Vicodin ES one or two q.6h. p.r.n. pain.  DIET:  As tolerated.   ACTIVITY:  As tolerated.  Limited use of right hand.   FOLLOWUP:  The patient is to follow up with Dr. Darrelyn Hillock in one week.   CONDITION ON DISCHARGE:  Improved.     Ebbie Ridge. Paitsel, P.A.                     Ronald A. Darrelyn Hillock, M.D.    Tilden Dome  D:  10/12/2002  T:  10/13/2002  Job:  [id number]

## 2010-09-11 NOTE — Op Note (Signed)
NAME:  Adam Waters, Adam Waters                           ACCOUNT NO.:  [id number]   MEDICAL RECORD NO.:  [id number]                   PATIENT TYPE:  AMB   LOCATION:  ENDO                                 FACILITY:  [facility]   PHYSICIAN:  John C. Madilyn Fireman, M.D.                 DATE OF BIRTH:  May 29, 1945   DATE OF PROCEDURE:  08/30/2003  DATE OF DISCHARGE:                                 OPERATIVE REPORT   OPERATION:  Colonoscopy.   INDICATIONS FOR PROCEDURE:  Average risk colon cancer screening in a 65-year-  old patient with no recent screening.   PROCEDURE:  The patient was placed in the left lateral decubitus position  and placed on the pulse monitor with continuous low flow oxygen delivered by  nasal cannula.  He was sedated with 90 mcg IV Fentanyl and 9 mg IV Versed.  The Olympus video colonoscope was inserted into the rectum and advanced to  the cecum, confirmed by transillumination of McBurney's point and  visualization of the ileocecal valve and appendiceal orifice.  The prep was  excellent.  The cecum, ascending, transverse, descending, sigmoid, and  rectum all appeared normal with no masses, polyps, diverticula or other  mucosal abnormalities.  The rectum, likewise, appeared normal and retroflex  view of the anus revealed no obvious internal hemorrhoids.  The scope was  then withdrawn and the patient returned to the recovery room in stable  condition.  He tolerated the procedure well and there were no immediate  complications.   IMPRESSION:  Normal colonoscopy.   PLAN:  Consider next colon screening by sigmoidoscopy or hemoccult in five  years at Dr. Alonza Smoker discretion, otherwise, consider repeat colonoscopy in  ten years.                                               John C. Madilyn Fireman, M.D.    JCH/MEDQ  D:  08/30/2003  T:  08/30/2003  Job:  [id number]   cc:   Kingsley Callander. Ouida Sills, M.D.  [address]  [place]  [place] [address]  Fax: [phone]

## 2010-09-11 NOTE — H&P (Signed)
NAMEKENDLE, ERKER                           ACCOUNT NO.:  [id number]   MEDICAL RECORD NO.:  [id number]                   PATIENT TYPE:  INP   LOCATION:  0345                                 FACILITY:  [facility]   PHYSICIAN:  Georges Lynch. Gioffre, M.D.             DATE OF BIRTH:  [birth year]   DATE OF ADMISSION:  10/03/2002  DATE OF DISCHARGE:                                HISTORY & PHYSICAL   HISTORY OF PRESENT ILLNESS:  The patient has had right elbow pain for the  past five days. He has had some increase in pain and swelling. He was seen  by his primary care Maleeya Peterkin, Dr. Abran Cantor and treated with IM Rocephin and  started on antibiotics yesterday. He has had no improvement. He has been  febrile for the past two days.   ALLERGIES:  None known.   CURRENT MEDICATIONS:  Toprol XL 50 mg daily, Zocor 40 mg daily, Zetia 10 mg  daily, Altace 10 mg daily, Lipitor 40 mg daily, Amoxicillin.   PAST MEDICAL HISTORY:  Hypertension, hypercholesterolemia, noninsulin-  dependent diabetes.   FAMILY HISTORY:  Unknown.   REVIEW OF SYSTEMS:  GENERAL: Denies weight change, fatigue. He does have  fevers. Has had documented temp of 102 for the past two days. HEENT: Denies  headache, visual changes, tinnitus, hearing loss or sore throat.  CARDIOVASCULAR: Denies chest pain, palpitations, shortness of breath,  orthopnea. GI: Denies dysphagia, nausea, vomiting, hematemesis or abdominal  pain. GU: Denies dysuria, frequency, urgency, hematuria. ENDOCRINE: Denies  polyuria, polydipsia, appetite change, heat or cold intolerance.  MUSCULOSKELETAL: Does have swelling and pain in his right elbow. NEUROLOGIC:  Denies dizziness, vertigo, syncope, seizures. SKIN: Denies itching, rashes,  masses or moles. He does have erythematous right elbow.   PHYSICAL EXAMINATION:  VITAL SIGNS: Temperature 101, pulse 92, respirations  18, blood pressure 120/80 right arm sitting.  GENERAL: A 65 year old male in no acute distress.  HEENT: PERRL. Extraocular muscles intact. Pharynx clear. Tm's intact.  NECK: Supple without masses. No carotid bruits detected.  CHEST: Clear to auscultation bilaterally. No wheezing, rales, or rhonchi  noted.  HEART: Regular rate and rhythm. Without murmur, rub, or gallop.  ABDOMEN: Positive bowel sounds. Soft and nontender. No organomegaly or  abnormal masses.  EXTREMITIES: His right elbow is erythematous and edematous. He as ascending  lymphangitis. He has good range of motion which is nontender.  SKIN: Is warm to touch in his right elbow.   RADIOLOGIC STUDIES:  X-ray of his right elbow is negative.    IMPRESSION:  Cellulitis right elbow.   PLAN:  The patient is to be admitted to [facility] for IV  antibiotics.     Ebbie Ridge. Paitsel, P.A.                     Ronald A. Darrelyn Hillock, M.D.    ZOX/WRUE  D:  10/04/2002  T:  10/04/2002  Job:  [id number]

## 2011-01-15 LAB — BASIC METABOLIC PANEL
BUN: 14
CO2: 30
CO2: 31
Chloride: 102
Chloride: 103
Creatinine, Ser: 1.69 — ABNORMAL HIGH
GFR calc Af Amer: 50 — ABNORMAL LOW
Glucose, Bld: 92
Potassium: 4.2
Sodium: 137

## 2011-01-15 LAB — CBC
Hemoglobin: 14.7
MCHC: 34.1
MCV: 104.9 — ABNORMAL HIGH
RBC: 4.12 — ABNORMAL LOW

## 2011-01-21 ENCOUNTER — Ambulatory Visit: Payer: Self-pay | Admitting: Otolaryngology

## 2011-01-29 LAB — PROTIME-INR: Prothrombin Time: 23.2 — ABNORMAL HIGH

## 2011-02-04 LAB — PROTIME-INR
INR: 2.5 — ABNORMAL HIGH
Prothrombin Time: 27.7 — ABNORMAL HIGH

## 2011-02-11 LAB — BASIC METABOLIC PANEL
Calcium: 8.8
Calcium: 9
Chloride: 100
Creatinine, Ser: 1.39
GFR calc Af Amer: >60
GFR calc non Af Amer: 52 — ABNORMAL LOW
GFR calc non Af Amer: >60
Potassium: 3.8
Sodium: 138

## 2011-02-11 LAB — CBC
HCT: 46.2
HCT: 47.3
Hemoglobin: 14.5
Hemoglobin: 15.6
Hemoglobin: 15.7
Hemoglobin: 16.1
MCHC: 33.3
MCHC: 34
MCV: 97.7
Platelets: 211
RBC: 4.36
RBC: 4.73
RBC: 4.85
RDW: 16.2 — ABNORMAL HIGH
RDW: 16.5 — ABNORMAL HIGH
WBC: 10.1
WBC: 7.4

## 2011-02-11 LAB — PROTIME-INR
INR: 2.1 — ABNORMAL HIGH
INR: 2.2 — ABNORMAL HIGH
Prothrombin Time: 24.2 — ABNORMAL HIGH

## 2011-02-11 LAB — COMPREHENSIVE METABOLIC PANEL
ALT: 35
ALT: 47
AST: 48 — ABNORMAL HIGH
Albumin: 3.9
Alkaline Phosphatase: 45
CO2: 26
Calcium: 8.8
Calcium: 9.2
Chloride: 110
GFR calc Af Amer: 47 — ABNORMAL LOW
GFR calc non Af Amer: 49 — ABNORMAL LOW
Glucose, Bld: 117 — ABNORMAL HIGH
Sodium: 137
Sodium: 141
Total Bilirubin: 0.7
Total Protein: 7.1

## 2011-02-11 LAB — I-STAT 8, (EC8 V) (CONVERTED LAB)
BUN: 34 — ABNORMAL HIGH
Chloride: 99
Glucose, Bld: 101 — ABNORMAL HIGH
HCT: 52
Hemoglobin: 17.7 — ABNORMAL HIGH
Operator id: 208821
Sodium: 136

## 2011-02-11 LAB — RAPID URINE DRUG SCREEN, HOSP PERFORMED
Opiates: NOT DETECTED
Tetrahydrocannabinol: NOT DETECTED

## 2011-02-11 LAB — DIFFERENTIAL
Basophils Absolute: 0.1
Basophils Relative: 1
Eosinophils Relative: 4
Monocytes Absolute: 0.8 — ABNORMAL HIGH
Monocytes Relative: 9
Neutro Abs: 5

## 2011-02-11 LAB — CARDIAC PANEL(CRET KIN+CKTOT+MB+TROPI)
CK, MB: 4.5 — ABNORMAL HIGH
CK, MB: 5.8 — ABNORMAL HIGH
Relative Index: 1.7
Relative Index: 1.7
Relative Index: 1.8
Troponin I: 0.05

## 2011-02-11 LAB — TSH: TSH: 2.319

## 2011-02-11 LAB — URINALYSIS, ROUTINE W REFLEX MICROSCOPIC
Bilirubin Urine: NEGATIVE
Glucose, UA: NEGATIVE
Nitrite: NEGATIVE
Specific Gravity, Urine: 1.033 — ABNORMAL HIGH
pH: 7.5

## 2011-02-11 LAB — URINE MICROSCOPIC-ADD ON

## 2011-02-11 LAB — APTT
aPTT: 33
aPTT: 33
aPTT: 34

## 2011-02-11 LAB — HEPARIN LEVEL (UNFRACTIONATED): Heparin Unfractionated: 0.21 — ABNORMAL LOW

## 2011-02-11 LAB — B-NATRIURETIC PEPTIDE (CONVERTED LAB): Pro B Natriuretic peptide (BNP): 252 — ABNORMAL HIGH

## 2011-02-11 LAB — POCT I-STAT CREATININE: Operator id: 208821

## 2011-02-25 ENCOUNTER — Ambulatory Visit: Payer: Self-pay | Admitting: Otolaryngology

## 2011-05-03 DIAGNOSIS — C44621 Squamous cell carcinoma of skin of unspecified upper limb, including shoulder: Secondary | ICD-10-CM | POA: Diagnosis not present

## 2011-05-03 DIAGNOSIS — L570 Actinic keratosis: Secondary | ICD-10-CM | POA: Diagnosis not present

## 2011-05-03 DIAGNOSIS — L821 Other seborrheic keratosis: Secondary | ICD-10-CM | POA: Diagnosis not present

## 2011-05-06 DIAGNOSIS — N289 Disorder of kidney and ureter, unspecified: Secondary | ICD-10-CM | POA: Diagnosis not present

## 2011-05-06 DIAGNOSIS — Z79899 Other long term (current) drug therapy: Secondary | ICD-10-CM | POA: Diagnosis not present

## 2011-05-06 DIAGNOSIS — Z941 Heart transplant status: Secondary | ICD-10-CM | POA: Diagnosis not present

## 2011-05-06 DIAGNOSIS — T8620 Unspecified complication of heart transplant: Secondary | ICD-10-CM | POA: Diagnosis not present

## 2011-05-06 DIAGNOSIS — Z48298 Encounter for aftercare following other organ transplant: Secondary | ICD-10-CM | POA: Diagnosis not present

## 2011-05-06 DIAGNOSIS — I10 Essential (primary) hypertension: Secondary | ICD-10-CM | POA: Diagnosis not present

## 2011-05-06 DIAGNOSIS — R29898 Other symptoms and signs involving the musculoskeletal system: Secondary | ICD-10-CM | POA: Diagnosis not present

## 2011-05-26 DIAGNOSIS — E785 Hyperlipidemia, unspecified: Secondary | ICD-10-CM | POA: Diagnosis present

## 2011-05-26 DIAGNOSIS — Z85828 Personal history of other malignant neoplasm of skin: Secondary | ICD-10-CM | POA: Diagnosis not present

## 2011-05-26 DIAGNOSIS — I69998 Other sequelae following unspecified cerebrovascular disease: Secondary | ICD-10-CM | POA: Diagnosis not present

## 2011-05-26 DIAGNOSIS — N289 Disorder of kidney and ureter, unspecified: Secondary | ICD-10-CM | POA: Diagnosis not present

## 2011-05-26 DIAGNOSIS — Z941 Heart transplant status: Secondary | ICD-10-CM | POA: Diagnosis not present

## 2011-05-26 DIAGNOSIS — G4700 Insomnia, unspecified: Secondary | ICD-10-CM | POA: Diagnosis present

## 2011-05-26 DIAGNOSIS — N179 Acute kidney failure, unspecified: Secondary | ICD-10-CM | POA: Diagnosis present

## 2011-05-26 DIAGNOSIS — G4733 Obstructive sleep apnea (adult) (pediatric): Secondary | ICD-10-CM | POA: Diagnosis present

## 2011-05-26 DIAGNOSIS — T8620 Unspecified complication of heart transplant: Secondary | ICD-10-CM | POA: Diagnosis not present

## 2011-05-26 DIAGNOSIS — I129 Hypertensive chronic kidney disease with stage 1 through stage 4 chronic kidney disease, or unspecified chronic kidney disease: Secondary | ICD-10-CM | POA: Diagnosis present

## 2011-05-26 DIAGNOSIS — E876 Hypokalemia: Secondary | ICD-10-CM | POA: Diagnosis present

## 2011-05-26 DIAGNOSIS — R5381 Other malaise: Secondary | ICD-10-CM | POA: Diagnosis present

## 2011-05-26 DIAGNOSIS — E663 Overweight: Secondary | ICD-10-CM | POA: Diagnosis present

## 2011-05-26 DIAGNOSIS — F329 Major depressive disorder, single episode, unspecified: Secondary | ICD-10-CM | POA: Diagnosis present

## 2011-05-26 DIAGNOSIS — Z6827 Body mass index (BMI) 27.0-27.9, adult: Secondary | ICD-10-CM | POA: Diagnosis not present

## 2011-05-26 DIAGNOSIS — I10 Essential (primary) hypertension: Secondary | ICD-10-CM | POA: Diagnosis not present

## 2011-05-26 DIAGNOSIS — Z48298 Encounter for aftercare following other organ transplant: Secondary | ICD-10-CM | POA: Diagnosis not present

## 2011-05-26 DIAGNOSIS — R634 Abnormal weight loss: Secondary | ICD-10-CM | POA: Diagnosis present

## 2011-05-26 DIAGNOSIS — M109 Gout, unspecified: Secondary | ICD-10-CM | POA: Diagnosis present

## 2011-05-26 DIAGNOSIS — L719 Rosacea, unspecified: Secondary | ICD-10-CM | POA: Diagnosis present

## 2011-06-02 DIAGNOSIS — Z48298 Encounter for aftercare following other organ transplant: Secondary | ICD-10-CM | POA: Diagnosis not present

## 2011-06-02 DIAGNOSIS — Z941 Heart transplant status: Secondary | ICD-10-CM | POA: Diagnosis not present

## 2011-06-02 DIAGNOSIS — Z79899 Other long term (current) drug therapy: Secondary | ICD-10-CM | POA: Diagnosis not present

## 2011-06-02 DIAGNOSIS — Z298 Encounter for other specified prophylactic measures: Secondary | ICD-10-CM | POA: Diagnosis not present

## 2011-06-10 DIAGNOSIS — Z79899 Other long term (current) drug therapy: Secondary | ICD-10-CM | POA: Diagnosis not present

## 2011-06-10 DIAGNOSIS — Z941 Heart transplant status: Secondary | ICD-10-CM | POA: Diagnosis not present

## 2011-06-10 DIAGNOSIS — Z48298 Encounter for aftercare following other organ transplant: Secondary | ICD-10-CM | POA: Diagnosis not present

## 2011-07-09 DIAGNOSIS — Z941 Heart transplant status: Secondary | ICD-10-CM | POA: Diagnosis not present

## 2011-07-09 DIAGNOSIS — Z79899 Other long term (current) drug therapy: Secondary | ICD-10-CM | POA: Diagnosis not present

## 2011-07-09 DIAGNOSIS — Z48298 Encounter for aftercare following other organ transplant: Secondary | ICD-10-CM | POA: Diagnosis not present

## 2011-07-09 DIAGNOSIS — Z298 Encounter for other specified prophylactic measures: Secondary | ICD-10-CM | POA: Diagnosis not present

## 2011-08-02 DIAGNOSIS — D239 Other benign neoplasm of skin, unspecified: Secondary | ICD-10-CM | POA: Diagnosis not present

## 2011-08-02 DIAGNOSIS — L570 Actinic keratosis: Secondary | ICD-10-CM | POA: Diagnosis not present

## 2011-08-02 DIAGNOSIS — Z85828 Personal history of other malignant neoplasm of skin: Secondary | ICD-10-CM | POA: Diagnosis not present

## 2011-08-02 DIAGNOSIS — C4442 Squamous cell carcinoma of skin of scalp and neck: Secondary | ICD-10-CM | POA: Diagnosis not present

## 2011-08-10 DIAGNOSIS — S139XXA Sprain of joints and ligaments of unspecified parts of neck, initial encounter: Secondary | ICD-10-CM | POA: Diagnosis not present

## 2011-08-10 DIAGNOSIS — M542 Cervicalgia: Secondary | ICD-10-CM | POA: Diagnosis not present

## 2011-08-16 DIAGNOSIS — Z941 Heart transplant status: Secondary | ICD-10-CM | POA: Diagnosis not present

## 2011-08-16 DIAGNOSIS — N289 Disorder of kidney and ureter, unspecified: Secondary | ICD-10-CM | POA: Diagnosis not present

## 2011-08-16 DIAGNOSIS — N189 Chronic kidney disease, unspecified: Secondary | ICD-10-CM | POA: Diagnosis not present

## 2011-08-16 DIAGNOSIS — Z79899 Other long term (current) drug therapy: Secondary | ICD-10-CM | POA: Diagnosis not present

## 2011-08-16 DIAGNOSIS — I10 Essential (primary) hypertension: Secondary | ICD-10-CM | POA: Diagnosis not present

## 2011-08-16 DIAGNOSIS — Z48298 Encounter for aftercare following other organ transplant: Secondary | ICD-10-CM | POA: Diagnosis not present

## 2011-08-16 DIAGNOSIS — D899 Disorder involving the immune mechanism, unspecified: Secondary | ICD-10-CM | POA: Diagnosis not present

## 2011-08-16 DIAGNOSIS — E119 Type 2 diabetes mellitus without complications: Secondary | ICD-10-CM | POA: Diagnosis not present

## 2011-08-31 DIAGNOSIS — Z940 Kidney transplant status: Secondary | ICD-10-CM | POA: Diagnosis not present

## 2011-08-31 DIAGNOSIS — Z298 Encounter for other specified prophylactic measures: Secondary | ICD-10-CM | POA: Diagnosis not present

## 2011-08-31 DIAGNOSIS — Z48298 Encounter for aftercare following other organ transplant: Secondary | ICD-10-CM | POA: Diagnosis not present

## 2011-08-31 DIAGNOSIS — Z79899 Other long term (current) drug therapy: Secondary | ICD-10-CM | POA: Diagnosis not present

## 2011-09-28 DIAGNOSIS — C44519 Basal cell carcinoma of skin of other part of trunk: Secondary | ICD-10-CM | POA: Diagnosis not present

## 2011-09-28 DIAGNOSIS — D239 Other benign neoplasm of skin, unspecified: Secondary | ICD-10-CM | POA: Diagnosis not present

## 2011-09-28 DIAGNOSIS — C44611 Basal cell carcinoma of skin of unspecified upper limb, including shoulder: Secondary | ICD-10-CM | POA: Diagnosis not present

## 2011-09-28 DIAGNOSIS — C44221 Squamous cell carcinoma of skin of unspecified ear and external auricular canal: Secondary | ICD-10-CM | POA: Diagnosis not present

## 2011-09-28 DIAGNOSIS — Z85828 Personal history of other malignant neoplasm of skin: Secondary | ICD-10-CM | POA: Diagnosis not present

## 2011-09-28 DIAGNOSIS — C44320 Squamous cell carcinoma of skin of unspecified parts of face: Secondary | ICD-10-CM | POA: Diagnosis not present

## 2011-09-28 DIAGNOSIS — L570 Actinic keratosis: Secondary | ICD-10-CM | POA: Diagnosis not present

## 2011-10-21 DIAGNOSIS — Z298 Encounter for other specified prophylactic measures: Secondary | ICD-10-CM | POA: Diagnosis not present

## 2011-10-21 DIAGNOSIS — Z48298 Encounter for aftercare following other organ transplant: Secondary | ICD-10-CM | POA: Diagnosis not present

## 2011-10-21 DIAGNOSIS — Z941 Heart transplant status: Secondary | ICD-10-CM | POA: Diagnosis not present

## 2011-10-21 DIAGNOSIS — Z79899 Other long term (current) drug therapy: Secondary | ICD-10-CM | POA: Diagnosis not present

## 2011-10-25 DIAGNOSIS — M9981 Other biomechanical lesions of cervical region: Secondary | ICD-10-CM | POA: Diagnosis not present

## 2011-10-25 DIAGNOSIS — M5030 Other cervical disc degeneration, unspecified cervical region: Secondary | ICD-10-CM | POA: Diagnosis not present

## 2011-10-27 DIAGNOSIS — C44320 Squamous cell carcinoma of skin of unspecified parts of face: Secondary | ICD-10-CM | POA: Diagnosis not present

## 2011-10-27 DIAGNOSIS — C44221 Squamous cell carcinoma of skin of unspecified ear and external auricular canal: Secondary | ICD-10-CM | POA: Diagnosis not present

## 2011-11-15 DIAGNOSIS — Z48298 Encounter for aftercare following other organ transplant: Secondary | ICD-10-CM | POA: Diagnosis not present

## 2011-11-15 DIAGNOSIS — Z941 Heart transplant status: Secondary | ICD-10-CM | POA: Diagnosis not present

## 2011-11-15 DIAGNOSIS — Z79899 Other long term (current) drug therapy: Secondary | ICD-10-CM | POA: Diagnosis not present

## 2011-11-15 DIAGNOSIS — G4700 Insomnia, unspecified: Secondary | ICD-10-CM | POA: Diagnosis not present

## 2011-11-15 DIAGNOSIS — D899 Disorder involving the immune mechanism, unspecified: Secondary | ICD-10-CM | POA: Diagnosis not present

## 2011-11-22 DIAGNOSIS — M5030 Other cervical disc degeneration, unspecified cervical region: Secondary | ICD-10-CM | POA: Diagnosis not present

## 2011-11-22 DIAGNOSIS — M9981 Other biomechanical lesions of cervical region: Secondary | ICD-10-CM | POA: Diagnosis not present

## 2011-12-28 DIAGNOSIS — Z85828 Personal history of other malignant neoplasm of skin: Secondary | ICD-10-CM | POA: Diagnosis not present

## 2011-12-28 DIAGNOSIS — L570 Actinic keratosis: Secondary | ICD-10-CM | POA: Diagnosis not present

## 2011-12-28 DIAGNOSIS — D0470 Carcinoma in situ of skin of unspecified lower limb, including hip: Secondary | ICD-10-CM | POA: Diagnosis not present

## 2011-12-28 DIAGNOSIS — C44721 Squamous cell carcinoma of skin of unspecified lower limb, including hip: Secondary | ICD-10-CM | POA: Diagnosis not present

## 2011-12-28 DIAGNOSIS — L821 Other seborrheic keratosis: Secondary | ICD-10-CM | POA: Diagnosis not present

## 2011-12-28 DIAGNOSIS — D0460 Carcinoma in situ of skin of unspecified upper limb, including shoulder: Secondary | ICD-10-CM | POA: Diagnosis not present

## 2011-12-28 DIAGNOSIS — C44621 Squamous cell carcinoma of skin of unspecified upper limb, including shoulder: Secondary | ICD-10-CM | POA: Diagnosis not present

## 2011-12-28 DIAGNOSIS — D485 Neoplasm of uncertain behavior of skin: Secondary | ICD-10-CM | POA: Diagnosis not present

## 2011-12-31 DIAGNOSIS — B259 Cytomegaloviral disease, unspecified: Secondary | ICD-10-CM | POA: Diagnosis not present

## 2011-12-31 DIAGNOSIS — Z941 Heart transplant status: Secondary | ICD-10-CM | POA: Diagnosis not present

## 2011-12-31 DIAGNOSIS — Z48298 Encounter for aftercare following other organ transplant: Secondary | ICD-10-CM | POA: Diagnosis not present

## 2011-12-31 DIAGNOSIS — Z79899 Other long term (current) drug therapy: Secondary | ICD-10-CM | POA: Diagnosis not present

## 2011-12-31 DIAGNOSIS — Z125 Encounter for screening for malignant neoplasm of prostate: Secondary | ICD-10-CM | POA: Diagnosis not present

## 2012-02-14 DIAGNOSIS — Z941 Heart transplant status: Secondary | ICD-10-CM | POA: Diagnosis not present

## 2012-02-14 DIAGNOSIS — Z48298 Encounter for aftercare following other organ transplant: Secondary | ICD-10-CM | POA: Diagnosis not present

## 2012-02-22 DIAGNOSIS — Z79899 Other long term (current) drug therapy: Secondary | ICD-10-CM | POA: Diagnosis not present

## 2012-02-22 DIAGNOSIS — Z941 Heart transplant status: Secondary | ICD-10-CM | POA: Diagnosis not present

## 2012-02-22 DIAGNOSIS — Z298 Encounter for other specified prophylactic measures: Secondary | ICD-10-CM | POA: Diagnosis not present

## 2012-02-22 DIAGNOSIS — Z48298 Encounter for aftercare following other organ transplant: Secondary | ICD-10-CM | POA: Diagnosis not present

## 2012-02-23 DIAGNOSIS — E785 Hyperlipidemia, unspecified: Secondary | ICD-10-CM | POA: Diagnosis not present

## 2012-02-28 DIAGNOSIS — E7800 Pure hypercholesterolemia, unspecified: Secondary | ICD-10-CM | POA: Diagnosis not present

## 2012-02-28 DIAGNOSIS — Z87891 Personal history of nicotine dependence: Secondary | ICD-10-CM | POA: Diagnosis not present

## 2012-02-28 DIAGNOSIS — E785 Hyperlipidemia, unspecified: Secondary | ICD-10-CM | POA: Diagnosis not present

## 2012-02-28 DIAGNOSIS — Z941 Heart transplant status: Secondary | ICD-10-CM | POA: Diagnosis not present

## 2012-02-28 DIAGNOSIS — Z48298 Encounter for aftercare following other organ transplant: Secondary | ICD-10-CM | POA: Diagnosis not present

## 2012-02-28 DIAGNOSIS — Z23 Encounter for immunization: Secondary | ICD-10-CM | POA: Diagnosis not present

## 2012-02-28 DIAGNOSIS — T8620 Unspecified complication of heart transplant: Secondary | ICD-10-CM | POA: Diagnosis not present

## 2012-02-28 DIAGNOSIS — Z79899 Other long term (current) drug therapy: Secondary | ICD-10-CM | POA: Diagnosis not present

## 2012-02-28 DIAGNOSIS — I359 Nonrheumatic aortic valve disorder, unspecified: Secondary | ICD-10-CM | POA: Diagnosis not present

## 2012-02-28 DIAGNOSIS — I517 Cardiomegaly: Secondary | ICD-10-CM | POA: Diagnosis not present

## 2012-02-28 DIAGNOSIS — D899 Disorder involving the immune mechanism, unspecified: Secondary | ICD-10-CM | POA: Diagnosis not present

## 2012-03-30 DIAGNOSIS — L570 Actinic keratosis: Secondary | ICD-10-CM | POA: Diagnosis not present

## 2012-03-30 DIAGNOSIS — Z85828 Personal history of other malignant neoplasm of skin: Secondary | ICD-10-CM | POA: Diagnosis not present

## 2012-03-30 DIAGNOSIS — D485 Neoplasm of uncertain behavior of skin: Secondary | ICD-10-CM | POA: Diagnosis not present

## 2012-03-30 DIAGNOSIS — L821 Other seborrheic keratosis: Secondary | ICD-10-CM | POA: Diagnosis not present

## 2012-03-30 DIAGNOSIS — B351 Tinea unguium: Secondary | ICD-10-CM | POA: Diagnosis not present

## 2012-03-30 DIAGNOSIS — C44211 Basal cell carcinoma of skin of unspecified ear and external auricular canal: Secondary | ICD-10-CM | POA: Diagnosis not present

## 2012-04-07 DIAGNOSIS — H52209 Unspecified astigmatism, unspecified eye: Secondary | ICD-10-CM | POA: Diagnosis not present

## 2012-04-07 DIAGNOSIS — H25019 Cortical age-related cataract, unspecified eye: Secondary | ICD-10-CM | POA: Diagnosis not present

## 2012-04-07 DIAGNOSIS — H2510 Age-related nuclear cataract, unspecified eye: Secondary | ICD-10-CM | POA: Diagnosis not present

## 2012-04-07 DIAGNOSIS — H11009 Unspecified pterygium of unspecified eye: Secondary | ICD-10-CM | POA: Diagnosis not present

## 2012-05-04 DIAGNOSIS — M542 Cervicalgia: Secondary | ICD-10-CM | POA: Diagnosis not present

## 2012-05-08 DIAGNOSIS — Z48298 Encounter for aftercare following other organ transplant: Secondary | ICD-10-CM | POA: Diagnosis not present

## 2012-05-08 DIAGNOSIS — M542 Cervicalgia: Secondary | ICD-10-CM | POA: Diagnosis not present

## 2012-05-08 DIAGNOSIS — Z941 Heart transplant status: Secondary | ICD-10-CM | POA: Diagnosis not present

## 2012-05-08 DIAGNOSIS — Z79899 Other long term (current) drug therapy: Secondary | ICD-10-CM | POA: Diagnosis not present

## 2012-05-08 DIAGNOSIS — M47812 Spondylosis without myelopathy or radiculopathy, cervical region: Secondary | ICD-10-CM | POA: Diagnosis not present

## 2012-06-06 DIAGNOSIS — M5020 Other cervical disc displacement, unspecified cervical region: Secondary | ICD-10-CM | POA: Diagnosis not present

## 2012-06-27 DIAGNOSIS — M5412 Radiculopathy, cervical region: Secondary | ICD-10-CM | POA: Diagnosis not present

## 2012-06-27 DIAGNOSIS — M542 Cervicalgia: Secondary | ICD-10-CM | POA: Diagnosis not present

## 2012-06-29 DIAGNOSIS — D485 Neoplasm of uncertain behavior of skin: Secondary | ICD-10-CM | POA: Diagnosis not present

## 2012-06-29 DIAGNOSIS — D239 Other benign neoplasm of skin, unspecified: Secondary | ICD-10-CM | POA: Diagnosis not present

## 2012-06-29 DIAGNOSIS — L723 Sebaceous cyst: Secondary | ICD-10-CM | POA: Diagnosis not present

## 2012-06-29 DIAGNOSIS — L570 Actinic keratosis: Secondary | ICD-10-CM | POA: Diagnosis not present

## 2012-06-29 DIAGNOSIS — Z85828 Personal history of other malignant neoplasm of skin: Secondary | ICD-10-CM | POA: Diagnosis not present

## 2012-06-29 DIAGNOSIS — L910 Hypertrophic scar: Secondary | ICD-10-CM | POA: Diagnosis not present

## 2012-06-29 DIAGNOSIS — L819 Disorder of pigmentation, unspecified: Secondary | ICD-10-CM | POA: Diagnosis not present

## 2012-06-29 DIAGNOSIS — D2320 Other benign neoplasm of skin of unspecified ear and external auricular canal: Secondary | ICD-10-CM | POA: Diagnosis not present

## 2012-07-03 ENCOUNTER — Other Ambulatory Visit: Payer: Self-pay | Admitting: Neurosurgery

## 2012-07-03 DIAGNOSIS — M542 Cervicalgia: Secondary | ICD-10-CM

## 2012-07-08 ENCOUNTER — Ambulatory Visit
Admission: RE | Admit: 2012-07-08 | Discharge: 2012-07-08 | Disposition: A | Discharge status: Home / Self Care | Payer: Medicare Other | Source: Ambulatory Visit | Attending: Neurosurgery | Admitting: Neurosurgery

## 2012-07-08 DIAGNOSIS — M47812 Spondylosis without myelopathy or radiculopathy, cervical region: Secondary | ICD-10-CM | POA: Diagnosis not present

## 2012-07-08 DIAGNOSIS — M4802 Spinal stenosis, cervical region: Secondary | ICD-10-CM | POA: Diagnosis not present

## 2012-07-08 DIAGNOSIS — M5020 Other cervical disc displacement, unspecified cervical region: Secondary | ICD-10-CM | POA: Diagnosis not present

## 2012-07-08 DIAGNOSIS — M542 Cervicalgia: Secondary | ICD-10-CM

## 2012-07-18 DIAGNOSIS — M542 Cervicalgia: Secondary | ICD-10-CM | POA: Diagnosis not present

## 2012-08-07 DIAGNOSIS — Z7901 Long term (current) use of anticoagulants: Secondary | ICD-10-CM | POA: Diagnosis not present

## 2012-08-07 DIAGNOSIS — Z5181 Encounter for therapeutic drug level monitoring: Secondary | ICD-10-CM | POA: Diagnosis not present

## 2012-08-07 DIAGNOSIS — Y830 Surgical operation with transplant of whole organ as the cause of abnormal reaction of the patient, or of later complication, without mention of misadventure at the time of the procedure: Secondary | ICD-10-CM | POA: Diagnosis not present

## 2012-08-07 DIAGNOSIS — I252 Old myocardial infarction: Secondary | ICD-10-CM | POA: Diagnosis not present

## 2012-08-07 DIAGNOSIS — I2510 Atherosclerotic heart disease of native coronary artery without angina pectoris: Secondary | ICD-10-CM | POA: Diagnosis not present

## 2012-08-07 DIAGNOSIS — I059 Rheumatic mitral valve disease, unspecified: Secondary | ICD-10-CM | POA: Diagnosis not present

## 2012-08-07 DIAGNOSIS — Z941 Heart transplant status: Secondary | ICD-10-CM | POA: Diagnosis not present

## 2012-08-07 DIAGNOSIS — R9431 Abnormal electrocardiogram [ECG] [EKG]: Secondary | ICD-10-CM | POA: Diagnosis not present

## 2012-08-07 DIAGNOSIS — G4700 Insomnia, unspecified: Secondary | ICD-10-CM | POA: Diagnosis not present

## 2012-08-07 DIAGNOSIS — Z79899 Other long term (current) drug therapy: Secondary | ICD-10-CM | POA: Diagnosis not present

## 2012-08-07 DIAGNOSIS — N189 Chronic kidney disease, unspecified: Secondary | ICD-10-CM | POA: Diagnosis not present

## 2012-08-07 DIAGNOSIS — T8620 Unspecified complication of heart transplant: Secondary | ICD-10-CM | POA: Diagnosis not present

## 2012-08-07 DIAGNOSIS — Z48298 Encounter for aftercare following other organ transplant: Secondary | ICD-10-CM | POA: Diagnosis not present

## 2012-08-07 DIAGNOSIS — I129 Hypertensive chronic kidney disease with stage 1 through stage 4 chronic kidney disease, or unspecified chronic kidney disease: Secondary | ICD-10-CM | POA: Diagnosis not present

## 2012-08-17 DIAGNOSIS — R112 Nausea with vomiting, unspecified: Secondary | ICD-10-CM | POA: Diagnosis not present

## 2012-08-31 DIAGNOSIS — M542 Cervicalgia: Secondary | ICD-10-CM | POA: Diagnosis not present

## 2012-09-28 DIAGNOSIS — D0460 Carcinoma in situ of skin of unspecified upper limb, including shoulder: Secondary | ICD-10-CM | POA: Diagnosis not present

## 2012-09-28 DIAGNOSIS — L910 Hypertrophic scar: Secondary | ICD-10-CM | POA: Diagnosis not present

## 2012-09-28 DIAGNOSIS — D485 Neoplasm of uncertain behavior of skin: Secondary | ICD-10-CM | POA: Diagnosis not present

## 2012-09-28 DIAGNOSIS — L570 Actinic keratosis: Secondary | ICD-10-CM | POA: Diagnosis not present

## 2012-09-28 DIAGNOSIS — L821 Other seborrheic keratosis: Secondary | ICD-10-CM | POA: Diagnosis not present

## 2012-09-28 DIAGNOSIS — Z85828 Personal history of other malignant neoplasm of skin: Secondary | ICD-10-CM | POA: Diagnosis not present

## 2012-10-25 DIAGNOSIS — Z48298 Encounter for aftercare following other organ transplant: Secondary | ICD-10-CM | POA: Diagnosis not present

## 2012-10-25 DIAGNOSIS — Z298 Encounter for other specified prophylactic measures: Secondary | ICD-10-CM | POA: Diagnosis not present

## 2012-10-25 DIAGNOSIS — Z941 Heart transplant status: Secondary | ICD-10-CM | POA: Diagnosis not present

## 2012-10-25 DIAGNOSIS — Z79899 Other long term (current) drug therapy: Secondary | ICD-10-CM | POA: Diagnosis not present

## 2012-11-06 DIAGNOSIS — Z85828 Personal history of other malignant neoplasm of skin: Secondary | ICD-10-CM | POA: Diagnosis not present

## 2012-11-06 DIAGNOSIS — L910 Hypertrophic scar: Secondary | ICD-10-CM | POA: Diagnosis not present

## 2012-12-01 DIAGNOSIS — L910 Hypertrophic scar: Secondary | ICD-10-CM | POA: Diagnosis not present

## 2012-12-01 DIAGNOSIS — Z85828 Personal history of other malignant neoplasm of skin: Secondary | ICD-10-CM | POA: Diagnosis not present

## 2012-12-29 DIAGNOSIS — L910 Hypertrophic scar: Secondary | ICD-10-CM | POA: Diagnosis not present

## 2012-12-29 DIAGNOSIS — L570 Actinic keratosis: Secondary | ICD-10-CM | POA: Diagnosis not present

## 2012-12-29 DIAGNOSIS — Z85828 Personal history of other malignant neoplasm of skin: Secondary | ICD-10-CM | POA: Diagnosis not present

## 2013-01-29 DIAGNOSIS — Z79899 Other long term (current) drug therapy: Secondary | ICD-10-CM | POA: Diagnosis not present

## 2013-01-29 DIAGNOSIS — T8620 Unspecified complication of heart transplant: Secondary | ICD-10-CM | POA: Diagnosis not present

## 2013-01-29 DIAGNOSIS — Z23 Encounter for immunization: Secondary | ICD-10-CM | POA: Diagnosis not present

## 2013-01-29 DIAGNOSIS — G4700 Insomnia, unspecified: Secondary | ICD-10-CM | POA: Diagnosis not present

## 2013-01-29 DIAGNOSIS — Z48298 Encounter for aftercare following other organ transplant: Secondary | ICD-10-CM | POA: Diagnosis not present

## 2013-01-29 DIAGNOSIS — Z941 Heart transplant status: Secondary | ICD-10-CM | POA: Diagnosis not present

## 2013-01-29 DIAGNOSIS — M5020 Other cervical disc displacement, unspecified cervical region: Secondary | ICD-10-CM | POA: Diagnosis not present

## 2013-02-02 DIAGNOSIS — L570 Actinic keratosis: Secondary | ICD-10-CM | POA: Diagnosis not present

## 2013-02-02 DIAGNOSIS — L910 Hypertrophic scar: Secondary | ICD-10-CM | POA: Diagnosis not present

## 2013-02-02 DIAGNOSIS — Z85828 Personal history of other malignant neoplasm of skin: Secondary | ICD-10-CM | POA: Diagnosis not present

## 2013-02-02 DIAGNOSIS — D485 Neoplasm of uncertain behavior of skin: Secondary | ICD-10-CM | POA: Diagnosis not present

## 2013-02-02 DIAGNOSIS — C44221 Squamous cell carcinoma of skin of unspecified ear and external auricular canal: Secondary | ICD-10-CM | POA: Diagnosis not present

## 2013-02-08 DIAGNOSIS — C44221 Squamous cell carcinoma of skin of unspecified ear and external auricular canal: Secondary | ICD-10-CM | POA: Diagnosis not present

## 2013-02-08 DIAGNOSIS — Z85828 Personal history of other malignant neoplasm of skin: Secondary | ICD-10-CM | POA: Diagnosis not present

## 2013-02-14 DIAGNOSIS — M5412 Radiculopathy, cervical region: Secondary | ICD-10-CM | POA: Diagnosis not present

## 2013-05-01 DIAGNOSIS — D1801 Hemangioma of skin and subcutaneous tissue: Secondary | ICD-10-CM | POA: Diagnosis not present

## 2013-05-01 DIAGNOSIS — D485 Neoplasm of uncertain behavior of skin: Secondary | ICD-10-CM | POA: Diagnosis not present

## 2013-05-01 DIAGNOSIS — L910 Hypertrophic scar: Secondary | ICD-10-CM | POA: Diagnosis not present

## 2013-05-01 DIAGNOSIS — D0470 Carcinoma in situ of skin of unspecified lower limb, including hip: Secondary | ICD-10-CM | POA: Diagnosis not present

## 2013-05-01 DIAGNOSIS — L570 Actinic keratosis: Secondary | ICD-10-CM | POA: Diagnosis not present

## 2013-05-01 DIAGNOSIS — Z85828 Personal history of other malignant neoplasm of skin: Secondary | ICD-10-CM | POA: Diagnosis not present

## 2013-05-01 DIAGNOSIS — B079 Viral wart, unspecified: Secondary | ICD-10-CM | POA: Diagnosis not present

## 2013-05-10 DIAGNOSIS — Z941 Heart transplant status: Secondary | ICD-10-CM | POA: Diagnosis not present

## 2013-05-10 DIAGNOSIS — Z792 Long term (current) use of antibiotics: Secondary | ICD-10-CM | POA: Diagnosis not present

## 2013-05-10 DIAGNOSIS — I517 Cardiomegaly: Secondary | ICD-10-CM | POA: Diagnosis not present

## 2013-05-10 DIAGNOSIS — Z79899 Other long term (current) drug therapy: Secondary | ICD-10-CM | POA: Diagnosis not present

## 2013-05-10 DIAGNOSIS — Z7982 Long term (current) use of aspirin: Secondary | ICD-10-CM | POA: Diagnosis not present

## 2013-05-10 DIAGNOSIS — Z85828 Personal history of other malignant neoplasm of skin: Secondary | ICD-10-CM | POA: Diagnosis not present

## 2013-05-10 DIAGNOSIS — N189 Chronic kidney disease, unspecified: Secondary | ICD-10-CM | POA: Diagnosis not present

## 2013-05-10 DIAGNOSIS — I129 Hypertensive chronic kidney disease with stage 1 through stage 4 chronic kidney disease, or unspecified chronic kidney disease: Secondary | ICD-10-CM | POA: Diagnosis not present

## 2013-05-10 DIAGNOSIS — Z48298 Encounter for aftercare following other organ transplant: Secondary | ICD-10-CM | POA: Diagnosis not present

## 2013-09-21 DIAGNOSIS — Z941 Heart transplant status: Secondary | ICD-10-CM | POA: Diagnosis not present

## 2013-09-21 DIAGNOSIS — Z79899 Other long term (current) drug therapy: Secondary | ICD-10-CM | POA: Diagnosis not present

## 2013-09-21 DIAGNOSIS — Z298 Encounter for other specified prophylactic measures: Secondary | ICD-10-CM | POA: Diagnosis not present

## 2013-09-21 DIAGNOSIS — Z48298 Encounter for aftercare following other organ transplant: Secondary | ICD-10-CM | POA: Diagnosis not present

## 2013-09-27 DIAGNOSIS — M109 Gout, unspecified: Secondary | ICD-10-CM | POA: Diagnosis not present

## 2013-12-18 DIAGNOSIS — I2510 Atherosclerotic heart disease of native coronary artery without angina pectoris: Secondary | ICD-10-CM | POA: Diagnosis not present

## 2013-12-18 DIAGNOSIS — N529 Male erectile dysfunction, unspecified: Secondary | ICD-10-CM | POA: Diagnosis not present

## 2014-01-04 DIAGNOSIS — H8110 Benign paroxysmal vertigo, unspecified ear: Secondary | ICD-10-CM | POA: Diagnosis not present

## 2014-01-09 DIAGNOSIS — M542 Cervicalgia: Secondary | ICD-10-CM | POA: Diagnosis not present

## 2014-01-09 DIAGNOSIS — M2560 Stiffness of unspecified joint, not elsewhere classified: Secondary | ICD-10-CM | POA: Diagnosis not present

## 2014-01-09 DIAGNOSIS — M6281 Muscle weakness (generalized): Secondary | ICD-10-CM | POA: Diagnosis not present

## 2014-01-09 DIAGNOSIS — R293 Abnormal posture: Secondary | ICD-10-CM | POA: Diagnosis not present

## 2014-01-15 DIAGNOSIS — R293 Abnormal posture: Secondary | ICD-10-CM | POA: Diagnosis not present

## 2014-01-15 DIAGNOSIS — M2560 Stiffness of unspecified joint, not elsewhere classified: Secondary | ICD-10-CM | POA: Diagnosis not present

## 2014-01-15 DIAGNOSIS — M542 Cervicalgia: Secondary | ICD-10-CM | POA: Diagnosis not present

## 2014-01-15 DIAGNOSIS — M6281 Muscle weakness (generalized): Secondary | ICD-10-CM | POA: Diagnosis not present

## 2014-01-21 DIAGNOSIS — M542 Cervicalgia: Secondary | ICD-10-CM | POA: Diagnosis not present

## 2014-01-21 DIAGNOSIS — M2560 Stiffness of unspecified joint, not elsewhere classified: Secondary | ICD-10-CM | POA: Diagnosis not present

## 2014-01-21 DIAGNOSIS — R293 Abnormal posture: Secondary | ICD-10-CM | POA: Diagnosis not present

## 2014-01-21 DIAGNOSIS — M6281 Muscle weakness (generalized): Secondary | ICD-10-CM | POA: Diagnosis not present

## 2014-01-22 DIAGNOSIS — Z48298 Encounter for aftercare following other organ transplant: Secondary | ICD-10-CM | POA: Diagnosis not present

## 2014-01-22 DIAGNOSIS — Z298 Encounter for other specified prophylactic measures: Secondary | ICD-10-CM | POA: Diagnosis not present

## 2014-01-22 DIAGNOSIS — Z941 Heart transplant status: Secondary | ICD-10-CM | POA: Diagnosis not present

## 2014-01-22 DIAGNOSIS — Z79899 Other long term (current) drug therapy: Secondary | ICD-10-CM | POA: Diagnosis not present

## 2014-01-23 DIAGNOSIS — M6281 Muscle weakness (generalized): Secondary | ICD-10-CM | POA: Diagnosis not present

## 2014-01-23 DIAGNOSIS — R293 Abnormal posture: Secondary | ICD-10-CM | POA: Diagnosis not present

## 2014-01-23 DIAGNOSIS — M2560 Stiffness of unspecified joint, not elsewhere classified: Secondary | ICD-10-CM | POA: Diagnosis not present

## 2014-01-23 DIAGNOSIS — M542 Cervicalgia: Secondary | ICD-10-CM | POA: Diagnosis not present

## 2014-01-24 HISTORY — PX: CORONARY ANGIOPLASTY WITH STENT PLACEMENT: SHX49

## 2014-01-30 DIAGNOSIS — M542 Cervicalgia: Secondary | ICD-10-CM | POA: Diagnosis not present

## 2014-01-30 DIAGNOSIS — R293 Abnormal posture: Secondary | ICD-10-CM | POA: Diagnosis not present

## 2014-01-30 DIAGNOSIS — M6281 Muscle weakness (generalized): Secondary | ICD-10-CM | POA: Diagnosis not present

## 2014-01-30 DIAGNOSIS — M2560 Stiffness of unspecified joint, not elsewhere classified: Secondary | ICD-10-CM | POA: Diagnosis not present

## 2014-02-01 DIAGNOSIS — M6281 Muscle weakness (generalized): Secondary | ICD-10-CM | POA: Diagnosis not present

## 2014-02-01 DIAGNOSIS — R293 Abnormal posture: Secondary | ICD-10-CM | POA: Diagnosis not present

## 2014-02-01 DIAGNOSIS — M542 Cervicalgia: Secondary | ICD-10-CM | POA: Diagnosis not present

## 2014-02-01 DIAGNOSIS — M2560 Stiffness of unspecified joint, not elsewhere classified: Secondary | ICD-10-CM | POA: Diagnosis not present

## 2014-02-03 DIAGNOSIS — I25811 Atherosclerosis of native coronary artery of transplanted heart without angina pectoris: Secondary | ICD-10-CM | POA: Diagnosis not present

## 2014-02-03 DIAGNOSIS — I10 Essential (primary) hypertension: Secondary | ICD-10-CM | POA: Diagnosis not present

## 2014-02-03 DIAGNOSIS — Z5181 Encounter for therapeutic drug level monitoring: Secondary | ICD-10-CM | POA: Diagnosis not present

## 2014-02-03 DIAGNOSIS — E785 Hyperlipidemia, unspecified: Secondary | ICD-10-CM | POA: Diagnosis not present

## 2014-02-03 DIAGNOSIS — Z87891 Personal history of nicotine dependence: Secondary | ICD-10-CM | POA: Diagnosis not present

## 2014-02-03 DIAGNOSIS — F329 Major depressive disorder, single episode, unspecified: Secondary | ICD-10-CM | POA: Diagnosis not present

## 2014-02-03 DIAGNOSIS — Z79899 Other long term (current) drug therapy: Secondary | ICD-10-CM | POA: Diagnosis not present

## 2014-02-03 DIAGNOSIS — Z23 Encounter for immunization: Secondary | ICD-10-CM | POA: Diagnosis not present

## 2014-02-04 DIAGNOSIS — I25811 Atherosclerosis of native coronary artery of transplanted heart without angina pectoris: Secondary | ICD-10-CM | POA: Diagnosis not present

## 2014-02-04 DIAGNOSIS — F329 Major depressive disorder, single episode, unspecified: Secondary | ICD-10-CM | POA: Diagnosis not present

## 2014-02-04 DIAGNOSIS — I129 Hypertensive chronic kidney disease with stage 1 through stage 4 chronic kidney disease, or unspecified chronic kidney disease: Secondary | ICD-10-CM | POA: Diagnosis not present

## 2014-02-04 DIAGNOSIS — I10 Essential (primary) hypertension: Secondary | ICD-10-CM | POA: Diagnosis not present

## 2014-02-04 DIAGNOSIS — Z23 Encounter for immunization: Secondary | ICD-10-CM | POA: Diagnosis not present

## 2014-02-04 DIAGNOSIS — Z48298 Encounter for aftercare following other organ transplant: Secondary | ICD-10-CM | POA: Diagnosis not present

## 2014-02-04 DIAGNOSIS — E785 Hyperlipidemia, unspecified: Secondary | ICD-10-CM | POA: Diagnosis not present

## 2014-02-04 DIAGNOSIS — Z79899 Other long term (current) drug therapy: Secondary | ICD-10-CM | POA: Diagnosis not present

## 2014-02-04 DIAGNOSIS — Z941 Heart transplant status: Secondary | ICD-10-CM | POA: Diagnosis not present

## 2014-02-05 DIAGNOSIS — Z23 Encounter for immunization: Secondary | ICD-10-CM | POA: Diagnosis not present

## 2014-02-05 DIAGNOSIS — I25811 Atherosclerosis of native coronary artery of transplanted heart without angina pectoris: Secondary | ICD-10-CM | POA: Diagnosis not present

## 2014-02-05 DIAGNOSIS — F329 Major depressive disorder, single episode, unspecified: Secondary | ICD-10-CM | POA: Diagnosis not present

## 2014-02-05 DIAGNOSIS — E785 Hyperlipidemia, unspecified: Secondary | ICD-10-CM | POA: Diagnosis not present

## 2014-02-05 DIAGNOSIS — I10 Essential (primary) hypertension: Secondary | ICD-10-CM | POA: Diagnosis not present

## 2014-02-05 DIAGNOSIS — Z79899 Other long term (current) drug therapy: Secondary | ICD-10-CM | POA: Diagnosis not present

## 2014-02-14 ENCOUNTER — Inpatient Hospital Stay (HOSPITAL_COMMUNITY): Payer: Medicare Other

## 2014-02-14 ENCOUNTER — Emergency Department (HOSPITAL_COMMUNITY): Payer: Medicare Other

## 2014-02-14 ENCOUNTER — Inpatient Hospital Stay (HOSPITAL_COMMUNITY)
Admission: EM | Admit: 2014-02-14 | Discharge: 2014-02-15 | DRG: 065 | Disposition: A | Discharge status: Home / Self Care | Payer: Medicare Other | Attending: Internal Medicine | Admitting: Internal Medicine

## 2014-02-14 ENCOUNTER — Encounter (HOSPITAL_COMMUNITY): Payer: Self-pay | Admitting: Emergency Medicine

## 2014-02-14 DIAGNOSIS — I129 Hypertensive chronic kidney disease with stage 1 through stage 4 chronic kidney disease, or unspecified chronic kidney disease: Secondary | ICD-10-CM | POA: Diagnosis present

## 2014-02-14 DIAGNOSIS — N183 Chronic kidney disease, stage 3 unspecified: Secondary | ICD-10-CM | POA: Diagnosis present

## 2014-02-14 DIAGNOSIS — J984 Other disorders of lung: Secondary | ICD-10-CM | POA: Diagnosis not present

## 2014-02-14 DIAGNOSIS — I4891 Unspecified atrial fibrillation: Secondary | ICD-10-CM | POA: Diagnosis present

## 2014-02-14 DIAGNOSIS — M6281 Muscle weakness (generalized): Secondary | ICD-10-CM | POA: Diagnosis not present

## 2014-02-14 DIAGNOSIS — I639 Cerebral infarction, unspecified: Secondary | ICD-10-CM | POA: Diagnosis present

## 2014-02-14 DIAGNOSIS — E785 Hyperlipidemia, unspecified: Secondary | ICD-10-CM | POA: Diagnosis present

## 2014-02-14 DIAGNOSIS — I6350 Cerebral infarction due to unspecified occlusion or stenosis of unspecified cerebral artery: Secondary | ICD-10-CM | POA: Diagnosis not present

## 2014-02-14 DIAGNOSIS — G4700 Insomnia, unspecified: Secondary | ICD-10-CM | POA: Diagnosis present

## 2014-02-14 DIAGNOSIS — Z7982 Long term (current) use of aspirin: Secondary | ICD-10-CM

## 2014-02-14 DIAGNOSIS — F17220 Nicotine dependence, chewing tobacco, uncomplicated: Secondary | ICD-10-CM | POA: Diagnosis present

## 2014-02-14 DIAGNOSIS — I25811 Atherosclerosis of native coronary artery of transplanted heart without angina pectoris: Secondary | ICD-10-CM | POA: Diagnosis present

## 2014-02-14 DIAGNOSIS — Z941 Heart transplant status: Secondary | ICD-10-CM

## 2014-02-14 DIAGNOSIS — R200 Anesthesia of skin: Secondary | ICD-10-CM | POA: Diagnosis not present

## 2014-02-14 DIAGNOSIS — Z955 Presence of coronary angioplasty implant and graft: Secondary | ICD-10-CM | POA: Diagnosis not present

## 2014-02-14 DIAGNOSIS — I6309 Cerebral infarction due to thrombosis of other precerebral artery: Secondary | ICD-10-CM

## 2014-02-14 DIAGNOSIS — I509 Heart failure, unspecified: Secondary | ICD-10-CM | POA: Diagnosis present

## 2014-02-14 DIAGNOSIS — H532 Diplopia: Secondary | ICD-10-CM | POA: Diagnosis present

## 2014-02-14 DIAGNOSIS — I739 Peripheral vascular disease, unspecified: Secondary | ICD-10-CM | POA: Diagnosis present

## 2014-02-14 DIAGNOSIS — Z79899 Other long term (current) drug therapy: Secondary | ICD-10-CM

## 2014-02-14 DIAGNOSIS — R531 Weakness: Secondary | ICD-10-CM | POA: Diagnosis present

## 2014-02-14 DIAGNOSIS — Z85828 Personal history of other malignant neoplasm of skin: Secondary | ICD-10-CM

## 2014-02-14 HISTORY — DX: Unspecified atrial fibrillation: I48.91

## 2014-02-14 HISTORY — DX: Atherosclerosis of native coronary artery of transplanted heart without angina pectoris: I25.811

## 2014-02-14 HISTORY — DX: Unspecified malignant neoplasm of skin, unspecified: C44.90

## 2014-02-14 HISTORY — DX: Heart failure, unspecified: I50.9

## 2014-02-14 HISTORY — DX: Chronic kidney disease, stage 3 unspecified: N18.30

## 2014-02-14 HISTORY — DX: Chronic kidney disease, stage 3 (moderate): N18.3

## 2014-02-14 HISTORY — DX: Heart transplant status: Z94.1

## 2014-02-14 HISTORY — DX: Hyperlipidemia, unspecified: E78.5

## 2014-02-14 HISTORY — DX: Cerebral infarction, unspecified: I63.9

## 2014-02-14 HISTORY — DX: Insomnia, unspecified: G47.00

## 2014-02-14 LAB — BASIC METABOLIC PANEL
Anion gap: 14 (ref 5–15)
BUN: 26 mg/dL — ABNORMAL HIGH (ref 6–23)
CO2: 23 mEq/L (ref 19–32)
Calcium: 9.2 mg/dL (ref 8.4–10.5)
Chloride: 100 mEq/L (ref 96–112)
Creatinine, Ser: 1.87 mg/dL — ABNORMAL HIGH (ref 0.50–1.35)
GFR calc Af Amer: 40 mL/min — ABNORMAL LOW (ref >90)
GFR calc non Af Amer: 35 mL/min — ABNORMAL LOW (ref >90)
Glucose, Bld: 94 mg/dL (ref 70–99)
Potassium: 4.3 mEq/L (ref 3.7–5.3)
Sodium: 137 mEq/L (ref 137–147)

## 2014-02-14 LAB — CBC WITH DIFFERENTIAL/PLATELET
Basophils Absolute: 0 10*3/uL (ref 0.0–0.1)
Basophils Relative: 0 % (ref 0–1)
Eosinophils Absolute: 0.2 10*3/uL (ref 0.0–0.7)
Eosinophils Relative: 3 % (ref 0–5)
HCT: 42.7 % (ref 39.0–52.0)
Hemoglobin: 14.2 g/dL (ref 13.0–17.0)
Lymphocytes Relative: 22 % (ref 12–46)
Lymphs Abs: 1.6 10*3/uL (ref 0.7–4.0)
MCH: 30.1 pg (ref 26.0–34.0)
MCHC: 33.3 g/dL (ref 30.0–36.0)
MCV: 90.7 fL (ref 78.0–100.0)
Monocytes Absolute: 0.6 10*3/uL (ref 0.1–1.0)
Monocytes Relative: 8 % (ref 3–12)
Neutro Abs: 4.8 10*3/uL (ref 1.7–7.7)
Neutrophils Relative %: 67 % (ref 43–77)
Platelets: 283 10*3/uL (ref 150–400)
RBC: 4.71 MIL/uL (ref 4.22–5.81)
RDW: 13.3 % (ref 11.5–15.5)
WBC: 7.2 10*3/uL (ref 4.0–10.5)

## 2014-02-14 MED ORDER — MYCOPHENOLATE MOFETIL 250 MG PO CAPS
1000.0000 mg | ORAL_CAPSULE | Freq: Two times a day (BID) | ORAL | Status: DC
  Administered 2014-02-14 – 2014-02-15 (×2): 1000 mg via ORAL
  Filled 2014-02-14 (×2): qty 4

## 2014-02-14 MED ORDER — ASPIRIN EC 81 MG PO TBEC
162.0000 mg | DELAYED_RELEASE_TABLET | Freq: Every evening | ORAL | Status: DC
  Administered 2014-02-14: 162 mg via ORAL
  Filled 2014-02-14 (×2): qty 2

## 2014-02-14 MED ORDER — ROSUVASTATIN CALCIUM 5 MG PO TABS
5.0000 mg | ORAL_TABLET | Freq: Every day | ORAL | Status: DC
  Administered 2014-02-15: 5 mg via ORAL
  Filled 2014-02-14: qty 1

## 2014-02-14 MED ORDER — SENNOSIDES-DOCUSATE SODIUM 8.6-50 MG PO TABS
1.0000 | ORAL_TABLET | Freq: Every day | ORAL | Status: DC
  Administered 2014-02-14: 1 via ORAL
  Filled 2014-02-14: qty 1

## 2014-02-14 MED ORDER — CYCLOSPORINE MODIFIED (NEORAL) 25 MG PO CAPS
75.0000 mg | ORAL_CAPSULE | Freq: Two times a day (BID) | ORAL | Status: DC
  Administered 2014-02-14 – 2014-02-15 (×2): 75 mg via ORAL
  Filled 2014-02-14 (×3): qty 3

## 2014-02-14 MED ORDER — CLOPIDOGREL BISULFATE 75 MG PO TABS
75.0000 mg | ORAL_TABLET | Freq: Every day | ORAL | Status: DC
  Administered 2014-02-15: 75 mg via ORAL
  Filled 2014-02-14 (×3): qty 1

## 2014-02-14 MED ORDER — ENOXAPARIN SODIUM 40 MG/0.4ML ~~LOC~~ SOLN
40.0000 mg | SUBCUTANEOUS | Status: DC
  Administered 2014-02-14: 40 mg via SUBCUTANEOUS
  Filled 2014-02-14: qty 0.4

## 2014-02-14 MED ORDER — STROKE: EARLY STAGES OF RECOVERY BOOK
Freq: Once | Status: AC
  Administered 2014-02-14: 22:00:00
  Filled 2014-02-14: qty 1

## 2014-02-14 MED ORDER — CLONAZEPAM 0.5 MG PO TABS
0.5000 mg | ORAL_TABLET | Freq: Two times a day (BID) | ORAL | Status: DC | PRN
  Administered 2014-02-14: 0.5 mg via ORAL
  Filled 2014-02-14: qty 1

## 2014-02-14 NOTE — Consult Note (Signed)
Referring Physician: Short    Chief Complaint: Left sided numbness, gait ataxia  HPI: Adam Waters is an 68 y.o. male who reports that he was at baseline until yesterday afternoon when while doing errands noted that his gait was unsteady.  He also felt a little dizzy.  Describes the dizziness as being off balance.  As the day progressed his symptoms worsened and he noted numbness on the left side of his face, the left hand and the left foot.  He did not wish to be seen at the hospital at that time.  The patient awakened today with the same symptoms.  When he accompanied his wife to the doctor today, he recommended that the patient be seen and he presented to the ED for further evaluation.   Patient had a similar episode on Labor Day weekend but this time his symptoms were associated with nausea and vomiting.  Episode lasted for about 2 days. Patient had a prior stroke in 2010 that affected the left side.  His symptoms cleared other than some clumsiness with the left hand.  Patient had a cardiac stent placement 2 weeks ago and has been on aspirin and Plavix since that time.  Was on aspirin alone prior to that time.     Date last known well: Date: 02/13/2014 Time last known well: Time: 14:30 tPA Given: No: Outside time window  Past Medical History  Diagnosis Date  . Stroke     2 strokes w/in 24 hrs of cardioversion  . Heart failure   . Heart transplanted   . CAD (coronary artery disease), native artery transplanted heart   . Atrial fibrillation   . Chronic kidney disease, stage 3   . Hyperlipidemia   . Insomnia   . Skin cancer     Past Surgical History  Procedure Laterality Date  . Heart transplant    . Coronary angioplasty with stent placement  01/2014    in transplanted heart at [facility]  . Knee surgery Left     Family History  Problem Relation Age of Onset  . Heart disease    . Stroke Neg Hx    Social History:  reports that he quit smoking about 4 years ago. His smoking use  included Cigarettes. He smoked 2.00 packs per day. He has quit using smokeless tobacco. His smokeless tobacco use included Chew. He reports that he does not drink alcohol or use illicit drugs.  Allergies: No Known Allergies  Medications: I have reviewed the patient's current medications. Prior to Admission:  Current outpatient prescriptions: amLODipine (NORVASC) 5 MG tablet, Take 5 mg by mouth daily., Disp: , Rfl: ;   aspirin 81 MG tablet, Take 162 mg by mouth every evening., Disp: , Rfl: ;   Calcium Carbonate-Vitamin D (CALCIUM-VITAMIN D) 500-200 MG-UNIT per tablet, Take 1 tablet by mouth daily., Disp: , Rfl: ;   clonazePAM (KLONOPIN) 0.5 MG tablet, Take 0.5 mg by mouth 3 times/day as needed-between meals & bedtime for anxiety (sleep)., Disp: , Rfl:  clopidogrel (PLAVIX) 75 MG tablet, Take 75 mg by mouth daily., Disp: , Rfl: ;   cycloSPORINE modified (GENGRAF) 25 MG capsule, Take 75 mg by mouth 2 (two) times daily. , Disp: , Rfl: ;   furosemide (LASIX) 20 MG tablet, Take 20 mg by mouth daily as needed for fluid or edema., Disp: , Rfl: ;   hydrALAZINE (APRESOLINE) 50 MG tablet, Take 100 mg by mouth 3 (three) times daily., Disp: , Rfl:  mycophenolate (CELLCEPT) 500  MG tablet, Take 1,000 mg by mouth 2 (two) times daily., Disp: , Rfl: ;   rosuvastatin (CRESTOR) 5 MG tablet, Take 5 mg by mouth daily., Disp: , Rfl:   ROS: History obtained from the patient  General ROS: fatigue Psychological ROS: negative for - behavioral disorder, hallucinations, memory difficulties, mood swings or suicidal ideation Ophthalmic ROS: negative for - blurry vision, double vision, eye pain or loss of vision ENT ROS: negative for - epistaxis, nasal discharge, oral lesions, sore throat, tinnitus or vertigo Allergy and Immunology ROS: negative for - hives or itchy/watery eyes Hematological and Lymphatic ROS: negative for - bleeding problems, bruising or swollen lymph nodes Endocrine ROS: negative for - galactorrhea,  hair pattern changes, polydipsia/polyuria or temperature intolerance Respiratory ROS: negative for - cough, hemoptysis, shortness of breath or wheezing Cardiovascular ROS: negative for - chest pain, dyspnea on exertion, edema or irregular heartbeat Gastrointestinal ROS: negative for - abdominal pain, diarrhea, hematemesis, nausea/vomiting or stool incontinence Genito-Urinary ROS: negative for - dysuria, hematuria, incontinence or urinary frequency/urgency Musculoskeletal ROS: negative for - joint swelling or muscular weakness Neurological ROS: as noted in HPI Dermatological ROS: negative for rash and skin lesion changes  Physical Examination: Blood pressure 136/76, pulse 72, temperature 98.2 F (36.8 C), temperature source Oral, resp. rate 19, SpO2 100.00%.  Neurologic Examination: Mental Status: Alert, oriented, thought content appropriate.  Speech fluent without evidence of aphasia.  Able to follow 3 step commands without difficulty. Cranial Nerves: II: Discs flat bilaterally; Visual fields grossly normal, pupils equal, round, reactive to light and accommodation III,IV, VI: ptosis not present, extra-ocular motions intact bilaterally V,VII: mild left facial droop, facial light touch sensation decreased just above the lip near the nose on the left.   VIII: hearing normal bilaterally IX,X: gag reflex present XI: bilateral shoulder shrug XII: midline tongue extension Motor: Right : Upper extremity   5/5    Left:     Upper extremity   5/5 with 4+/5 hand grip  Lower extremity   5/5     Lower extremity   5/5 with 5-/5 hip flexor strength Tone and bulk:normal tone throughout; no atrophy noted Sensory: Pinprick and light touch decreased in the left hand and the left foot, mostly at the toes Deep Tendon Reflexes: 2+ and symmetric throughout Plantars: Right: downgoing   Left: upgoing Cerebellar: Normal finger-to-nose testing on the right, dysmetria noted on the left.  Bilateral dysmetria  noted on heel-to-shin testing CV: pulses palpable throughout     Laboratory Studies:  Basic Metabolic Panel:  Recent Labs Lab 02/14/14 1028  NA 137  K 4.3  CL 100  CO2 23  GLUCOSE 94  BUN 26*  CREATININE 1.87*  CALCIUM 9.2    Liver Function Tests: No results found for this basename: AST, ALT, ALKPHOS, BILITOT, PROT, ALBUMIN,  in the last 168 hours No results found for this basename: LIPASE, AMYLASE,  in the last 168 hours No results found for this basename: AMMONIA,  in the last 168 hours  CBC:  Recent Labs Lab 02/14/14 1028  WBC 7.2  NEUTROABS 4.8  HGB 14.2  HCT 42.7  MCV 90.7  PLT 283    Cardiac Enzymes: No results found for this basename: CKTOTAL, CKMB, CKMBINDEX, TROPONINI,  in the last 168 hours  BNP: No components found with this basename: POCBNP,   CBG: No results found for this basename: GLUCAP,  in the last 168 hours  Microbiology: No results found for this or any previous visit.  Coagulation Studies:  No results found for this basename: LABPROT, INR,  in the last 72 hours  Urinalysis: No results found for this basename: COLORURINE, APPERANCEUR, LABSPEC, PHURINE, GLUCOSEU, HGBUR, BILIRUBINUR, KETONESUR, PROTEINUR, UROBILINOGEN, NITRITE, LEUKOCYTESUR,  in the last 168 hours  Lipid Panel:    Component Value Date/Time   CHOL  Value: 171        ATP III CLASSIFICATION:  <200     mg/dL   Desirable  200-239  mg/dL   Borderline High  >=240    mg/dL   High        08/19/2009 0556   TRIG 209* 08/19/2009 0556   HDL 36* 08/19/2009 0556   CHOLHDL 4.8 08/19/2009 0556   VLDL 42* 08/19/2009 0556   LDLCALC  Value: 93        Total Cholesterol/HDL:CHD Risk Coronary Heart Disease Risk Table                     Men   Women  1/2 Average Risk   3.4   3.3  Average Risk       5.0   4.4  2 X Average Risk   9.6   7.1  3 X Average Risk  23.4   11.0        Use the calculated Patient Ratio above and the CHD Risk Table to determine the patient's CHD Risk.        ATP III  CLASSIFICATION (LDL):  <100     mg/dL   Optimal  100-129  mg/dL   Near or Above                    Optimal  130-159  mg/dL   Borderline  160-189  mg/dL   High  >190     mg/dL   Very High 08/19/2009 0556    HgbA1C:  Lab Results  Component Value Date   HGBA1C  Value: 5.6 (NOTE) The ADA recommends the following therapeutic goal for glycemic control related to Hgb A1c measurement: Goal of therapy: <6.5 Hgb A1c  Reference: American Diabetes Association: Clinical Practice Recommendations 2010, Diabetes Care, 2010, 33: (Suppl  1). 03/05/2009    Urine Drug Screen:     Component Value Date/Time   LABOPIA NONE DETECTED 08/18/2009 0137   COCAINSCRNUR NONE DETECTED 08/18/2009 0137   LABBENZ POSITIVE* 08/18/2009 0137   AMPHETMU NONE DETECTED 08/18/2009 0137   THCU NONE DETECTED 08/18/2009 0137   LABBARB  Value: NONE DETECTED        DRUG SCREEN FOR MEDICAL PURPOSES ONLY.  IF CONFIRMATION IS NEEDED FOR ANY PURPOSE, NOTIFY LAB WITHIN 5 DAYS.        LOWEST DETECTABLE LIMITS FOR URINE DRUG SCREEN Drug Class       Cutoff (ng/mL) Amphetamine      1000 Barbiturate      200 Benzodiazepine   161 Tricyclics       096 Opiates          300 Cocaine          300 THC              50 08/18/2009 0137    Alcohol Level: No results found for this basename: ETH,  in the last 168 hours  Other results: EKG: sinus rhythm at 82 bpm.  Imaging: Ct Head Wo Contrast  02/14/2014   CLINICAL DATA:  Code stroke. Yesterday afternoon is experiencing left hand weakness and numbness.  EXAM: CT HEAD WITHOUT CONTRAST  TECHNIQUE:  Contiguous axial images were obtained from the base of the skull through the vertex without intravenous contrast.  COMPARISON:  CT brain 08/18/2009  FINDINGS: There is no evidence of mass effect, midline shift, or extra-axial fluid collections. There is no evidence of a space-occupying lesion or intracranial hemorrhage. There is no evidence of a cortical-based area of acute infarction. There is a large old right basal  ganglia infarct with encephalomalacia. There is an old left insular infarct. There is generalized cerebral atrophy. There is periventricular white matter low attenuation likely secondary to microangiopathy.  The ventricles and sulci are appropriate for the patient's age. The basal cisterns are patent.  Visualized portions of the orbits are unremarkable. The visualized portions of the paranasal sinuses and mastoid air cells are unremarkable. Cerebrovascular atherosclerotic calcifications are noted.  The osseous structures are unremarkable.  IMPRESSION: No acute intracranial pathology.  These results were called by telephone at the time of interpretation on 02/14/2014 at 10:50 am to Dr. Virgel Manifold , who verbally acknowledged these results.   Electronically Signed   By: Kathreen Devoid   On: 02/14/2014 10:52   Mr [name] Head Wo Contrast  02/14/2014   CLINICAL DATA:  Acute onset left facial numbness and left arm weakness. Coronary stent placed 02/04/2014  EXAM: MRI HEAD WITHOUT CONTRAST  MRA HEAD WITHOUT CONTRAST  TECHNIQUE: Multiplanar, multiecho pulse sequences of the brain and surrounding structures were obtained without intravenous contrast. Angiographic images of the head were obtained using MRA technique without contrast.  COMPARISON:  CT 02/14/2014.  MRI 08/18/2009  FINDINGS: MRI HEAD FINDINGS  Acute infarct right thalamus measures 8 x 10 mm. No other area of acute infarct.  Chronic hemorrhagic infarct right external capsule unchanged. Chronic infarct in the left insula. Chronic microvascular ischemia in the white matter. Brainstem and cerebellum intact.  Generalized atrophy.  Sub cm areas of susceptibility in the left frontal subarachnoid space may represent a small area of chronic hemorrhage. This was not present previously.  MRA HEAD FINDINGS  Both vertebral arteries patent to the basilar. PICA patent bilaterally. Basilar widely patent. Right superior cerebellar artery patent. Left superior cerebellar artery  poorly visualized and may be stenotic. Posterior cerebral arteries patent bilaterally.  Internal carotid artery patent bilaterally without stenosis. Anterior and middle cerebral artery patent bilaterally.  Negative for cerebral aneurysm.  IMPRESSION: Acute infarct right thalamus.  No other acute infarct  Atrophy and chronic ischemic change.  Possible stenosis left superior cerebellar artery. Otherwise negative MRA head.   Electronically Signed   By: Franchot Gallo M.D.   On: 02/14/2014 12:41   Mr Brain Wo Contrast  02/14/2014   CLINICAL DATA:  Acute onset left facial numbness and left arm weakness. Coronary stent placed 02/04/2014  EXAM: MRI HEAD WITHOUT CONTRAST  MRA HEAD WITHOUT CONTRAST  TECHNIQUE: Multiplanar, multiecho pulse sequences of the brain and surrounding structures were obtained without intravenous contrast. Angiographic images of the head were obtained using MRA technique without contrast.  COMPARISON:  CT 02/14/2014.  MRI 08/18/2009  FINDINGS: MRI HEAD FINDINGS  Acute infarct right thalamus measures 8 x 10 mm. No other area of acute infarct.  Chronic hemorrhagic infarct right external capsule unchanged. Chronic infarct in the left insula. Chronic microvascular ischemia in the white matter. Brainstem and cerebellum intact.  Generalized atrophy.  Sub cm areas of susceptibility in the left frontal subarachnoid space may represent a small area of chronic hemorrhage. This was not present previously.  MRA HEAD FINDINGS  Both vertebral arteries patent  to the basilar. PICA patent bilaterally. Basilar widely patent. Right superior cerebellar artery patent. Left superior cerebellar artery poorly visualized and may be stenotic. Posterior cerebral arteries patent bilaterally.  Internal carotid artery patent bilaterally without stenosis. Anterior and middle cerebral artery patent bilaterally.  Negative for cerebral aneurysm.  IMPRESSION: Acute infarct right thalamus.  No other acute infarct  Atrophy and  chronic ischemic change.  Possible stenosis left superior cerebellar artery. Otherwise negative MRA head.   Electronically Signed   By: Franchot Gallo M.D.   On: 02/14/2014 12:41    Assessment: 68 y.o. male presenting with gait ataxia and left sided numbness.  Patient had similar symptoms about 6 weeks ago that resolved after 2 days.  On ASA and Plavix due to cardiac disease.  MRI of the brain reviewed and shows an acute right thalamic infarct.  Unclear what was done with the patient's cardiac admission 2 weeks ago. Obtaining records from this hospitalization at [facility] may be helpful.  Further work up recommended.    Stroke Risk Factors - atrial fibrillation and hyperlipidemia  Plan: 1. HgbA1c, fasting lipid panel 2. PT consult, OT consult, Speech consult 3. Echocardiogram 4. Carotid dopplers 5. Prophylactic therapy-Continue ASA and Plavix daily 6. Risk factor modification 7. Telemetry monitoring 8. Frequent neuro checks 9. Obtain records from [facility], [place] [facility] [phone] 02/14/2014, 3:50 PM

## 2014-02-14 NOTE — ED Notes (Signed)
Pt transported to MRI 

## 2014-02-14 NOTE — ED Notes (Signed)
Pt states since yesterday afternoon he experiencing left hand weakness/numbness as well as numbness on left side of face and dizziness.  Pt had had stroke before with left side deficit but symptoms are worse than his baseline per pt's wife.

## 2014-02-14 NOTE — H&P (Signed)
[facility] History and Physical  Harlis Champoux Laursen UXL:[id number] DOB: [birth year] DOA: 02/14/2014  Referring physician:  Wilson Singer PCP:  No primary provider on file.   Chief Complaint:  Left hand, foot, and face numbness, dizziness, double vision  HPI:  The patient is a 68 y.o. year-old male with CKD stage III, hyperlipidemia, previous strokes without significant residual deficits, coronary artery disease with progressive heart failure for which he underwent heart transplant at [facility].  He is on chronic immunosuppression. He was seen at their clinic for cardiac catheterization 2 weeks ago because of progressive shortness of breath at which time he had an echocardiogram which demonstrated an ejection fraction of approximately 50%, no comment about LV thrombus. He had a cardiac catheterization which demonstrated an 80% ostial RCA stenosis which was stented with a drug-eluting stent. He was started on Plavix and discharged home in stable condition.  Since returning home he has felt well. Yesterday he suddenly developed some off-balance sensation of double vision. He had difficulty walking from one room to the next without intermittently needing to use the wall for support. This morning when he awoke, he continued to have double vision and balance problems but he also noticed some numbness around his left base near his mouth, in his left hand particularly his first second and third fingers, and his left underside of his foot and toes. He denied any confusion, facial droop, weakness. His wife also did not notice any facial droop or weakness. She commenced him to come to the emergency department for evaluation.  In the emergency department, vital signs are stable, labs were notable for creatinine of 1.87.  MRI of the brain demonstrated an acute right thalamic infarct of 8 x 10 mm, no other infarct.  He was found to have atrophy and chronic ischemic changes. There is possible stenosis of the left  superior cerebellar artery, otherwise his MRA of the head was negative. Neurology was notified by the emergency department physician and requested that the patient be admitted to [facility] so that the stroke team may continue to monitor the patient.    Review of Systems:  General:  Denies fevers, chills, weight loss or gain HEENT:  Denies changes to hearing and vision, rhinorrhea, sinus congestion, sore throat CV:  Denies chest pain and palpitations, lower extremity edema.  PULM:  Denies SOB, wheezing, cough.   GI:  Denies nausea, vomiting, constipation, diarrhea.   GU:  Denies dysuria, frequency, urgency ENDO:  Denies polyuria, polydipsia.   HEME:  Denies hematemesis, blood in stools, melena, abnormal bruising or bleeding.  LYMPH:  Denies lymphadenopathy.   MSK:  Denies arthralgias, myalgias.   DERM:  Denies skin rash or ulcer.   NEURO:  Per history of present illness.  PSYCH:  Denies anxiety and depression.    Past Medical History  Diagnosis Date  . Stroke     2 strokes w/in 24 hrs of cardioversion  . Heart failure   . Heart transplanted   . CAD (coronary artery disease), native artery transplanted heart   . Atrial fibrillation   . Chronic kidney disease, stage 3   . Hyperlipidemia   . Insomnia   . Skin cancer    Past Surgical History  Procedure Laterality Date  . Heart transplant    . Coronary angioplasty with stent placement  01/2014    in transplanted heart at [facility]  . Knee surgery Left    Social History:  reports that he quit smoking about 4 years  ago. His smoking use included Cigarettes. He smoked 2.00 packs per day. He has quit using smokeless tobacco. His smokeless tobacco use included Chew. He reports that he does not drink alcohol or use illicit drugs. Lives with wife, no assist device or [place] services.  Primary care for heart transplant is at [facility].    No Known Allergies  Family History  Problem Relation Age of Onset  . Heart disease    . Stroke Neg Hx       Prior to Admission medications   Medication Sig Start Date End Date Taking? Authorizing Provider  aspirin 81 MG tablet Take 162 mg by mouth every evening.   Yes Historical Provider, MD  clonazePAM (KLONOPIN) 0.5 MG tablet Take 0.5 mg by mouth 3 times/day as needed-between meals & bedtime for anxiety (sleep).   Yes Historical Provider, MD  clopidogrel (PLAVIX) 75 MG tablet Take 75 mg by mouth daily.   Yes Historical Provider, MD  cycloSPORINE modified (GENGRAF) 25 MG capsule Take 25 mg by mouth 3 (three) times daily.   Yes Historical Provider, MD  mycophenolate (CELLCEPT) 500 MG tablet Take 1,000 mg by mouth 2 (two) times daily.   Yes Historical Provider, MD  rosuvastatin (CRESTOR) 5 MG tablet Take 5 mg by mouth daily.   Yes Historical Provider, MD   Physical Exam: Filed Vitals:   02/14/14 1219 02/14/14 1245 02/14/14 1300 02/14/14 1315  BP: 184/98 166/96 158/95 136/76  Pulse: 77 76 73 72  Temp:      TempSrc:      Resp: 18 17 16 19   SpO2: 100% 97%  100%     General:  WM, NAD  Eyes:  PERRL, anicteric, non-injected.  ENT:  Nares clear.  OP clear, non-erythematous without plaques or exudates.  MMM.  Neck:  Supple without TM or JVD.    Lymph:  No cervical, supraclavicular, or submandibular LAD.  Cardiovascular:  RRR, normal S1, S2, without m/r/g.  2+ pulses, warm extremities  Respiratory:  CTA bilaterally without increased WOB.  Abdomen:  NABS.  Soft, ND/NT.    Skin:  No rashes or focal lesions.  Musculoskeletal:  Normal bulk and tone.  No LE edema.  Psychiatric:  A & O x 4 with possibly some subtle memory problems  Neurologic:  CN 3-12 intact with no current double vision or sensation deficits.  5/5 strength except 4/5 LUE.  Sensation intact except for tingling sensation on left 1st and 2nd fingers and under left foot but still able to feel light touch.  Dysmetria of the left hand and left leg.    Labs on Admission:  Basic Metabolic Panel:  Recent Labs Lab  02/14/14 1028  NA 137  K 4.3  CL 100  CO2 23  GLUCOSE 94  BUN 26*  CREATININE 1.87*  CALCIUM 9.2   Liver Function Tests: No results found for this basename: AST, ALT, ALKPHOS, BILITOT, PROT, ALBUMIN,  in the last 168 hours No results found for this basename: LIPASE, AMYLASE,  in the last 168 hours No results found for this basename: AMMONIA,  in the last 168 hours CBC:  Recent Labs Lab 02/14/14 1028  WBC 7.2  NEUTROABS 4.8  HGB 14.2  HCT 42.7  MCV 90.7  PLT 283   Cardiac Enzymes: No results found for this basename: CKTOTAL, CKMB, CKMBINDEX, TROPONINI,  in the last 168 hours  BNP (last 3 results) No results found for this basename: PROBNP,  in the last 8760 hours CBG: No results found  for this basename: GLUCAP,  in the last 168 hours  Radiological Exams on Admission: Ct Head Wo Contrast  02/14/2014   CLINICAL DATA:  Code stroke. Yesterday afternoon is experiencing left hand weakness and numbness.  EXAM: CT HEAD WITHOUT CONTRAST  TECHNIQUE: Contiguous axial images were obtained from the base of the skull through the vertex without intravenous contrast.  COMPARISON:  CT brain 08/18/2009  FINDINGS: There is no evidence of mass effect, midline shift, or extra-axial fluid collections. There is no evidence of a space-occupying lesion or intracranial hemorrhage. There is no evidence of a cortical-based area of acute infarction. There is a large old right basal ganglia infarct with encephalomalacia. There is an old left insular infarct. There is generalized cerebral atrophy. There is periventricular white matter low attenuation likely secondary to microangiopathy.  The ventricles and sulci are appropriate for the patient's age. The basal cisterns are patent.  Visualized portions of the orbits are unremarkable. The visualized portions of the paranasal sinuses and mastoid air cells are unremarkable. Cerebrovascular atherosclerotic calcifications are noted.  The osseous structures are  unremarkable.  IMPRESSION: No acute intracranial pathology.  These results were called by telephone at the time of interpretation on 02/14/2014 at 10:50 am to Dr. Virgel Manifold , who verbally acknowledged these results.   Electronically Signed   By: Kathreen Devoid   On: 02/14/2014 10:52   Mr [name] Head Wo Contrast  02/14/2014   CLINICAL DATA:  Acute onset left facial numbness and left arm weakness. Coronary stent placed 02/04/2014  EXAM: MRI HEAD WITHOUT CONTRAST  MRA HEAD WITHOUT CONTRAST  TECHNIQUE: Multiplanar, multiecho pulse sequences of the brain and surrounding structures were obtained without intravenous contrast. Angiographic images of the head were obtained using MRA technique without contrast.  COMPARISON:  CT 02/14/2014.  MRI 08/18/2009  FINDINGS: MRI HEAD FINDINGS  Acute infarct right thalamus measures 8 x 10 mm. No other area of acute infarct.  Chronic hemorrhagic infarct right external capsule unchanged. Chronic infarct in the left insula. Chronic microvascular ischemia in the white matter. Brainstem and cerebellum intact.  Generalized atrophy.  Sub cm areas of susceptibility in the left frontal subarachnoid space may represent a small area of chronic hemorrhage. This was not present previously.  MRA HEAD FINDINGS  Both vertebral arteries patent to the basilar. PICA patent bilaterally. Basilar widely patent. Right superior cerebellar artery patent. Left superior cerebellar artery poorly visualized and may be stenotic. Posterior cerebral arteries patent bilaterally.  Internal carotid artery patent bilaterally without stenosis. Anterior and middle cerebral artery patent bilaterally.  Negative for cerebral aneurysm.  IMPRESSION: Acute infarct right thalamus.  No other acute infarct  Atrophy and chronic ischemic change.  Possible stenosis left superior cerebellar artery. Otherwise negative MRA head.   Electronically Signed   By: Franchot Gallo M.D.   On: 02/14/2014 12:41   Mr Brain Wo  Contrast  02/14/2014   CLINICAL DATA:  Acute onset left facial numbness and left arm weakness. Coronary stent placed 02/04/2014  EXAM: MRI HEAD WITHOUT CONTRAST  MRA HEAD WITHOUT CONTRAST  TECHNIQUE: Multiplanar, multiecho pulse sequences of the brain and surrounding structures were obtained without intravenous contrast. Angiographic images of the head were obtained using MRA technique without contrast.  COMPARISON:  CT 02/14/2014.  MRI 08/18/2009  FINDINGS: MRI HEAD FINDINGS  Acute infarct right thalamus measures 8 x 10 mm. No other area of acute infarct.  Chronic hemorrhagic infarct right external capsule unchanged. Chronic infarct in the left insula. Chronic microvascular ischemia  in the white matter. Brainstem and cerebellum intact.  Generalized atrophy.  Sub cm areas of susceptibility in the left frontal subarachnoid space may represent a small area of chronic hemorrhage. This was not present previously.  MRA HEAD FINDINGS  Both vertebral arteries patent to the basilar. PICA patent bilaterally. Basilar widely patent. Right superior cerebellar artery patent. Left superior cerebellar artery poorly visualized and may be stenotic. Posterior cerebral arteries patent bilaterally.  Internal carotid artery patent bilaterally without stenosis. Anterior and middle cerebral artery patent bilaterally.  Negative for cerebral aneurysm.  IMPRESSION: Acute infarct right thalamus.  No other acute infarct  Atrophy and chronic ischemic change.  Possible stenosis left superior cerebellar artery. Otherwise negative MRA head.   Electronically Signed   By: Franchot Gallo M.D.   On: 02/14/2014 12:41    EKG: Independently reviewed. NSR with inverted T-waves in the lateral leads.    Assessment/Plan Principal Problem:   Acute ischemic stroke Active Problems:   Stroke   Heart transplanted   CAD (coronary artery disease), native artery transplanted heart   Chronic kidney disease, stage 3   Hyperlipidemia  ---  Acute  right thalamic stroke with residual left hand weakness and dizziness.  Patient was already on combination of aspirin and Plavix prior to admission. -  Continue aspirin plus Plavix for now, but patient may benefit from alternative agent given his recurrent strokes -   Telemetry -  Lipid panel and hemoglobin A1c -  Echocardiogram report being faxed from [place].  Defer to neurology whether repeating is necessary -  Carotid duplex -  PT/OT/speech therapy -  Neurology consultation  CAD in native artery of transplanted heart s/p DRUG ELUTING STENT placement on 02/04/2014 (two weeks ago) -  Continue aspirin and plavix -  Continue statin  Heart transplant patient on immunosuppression -  Continue cyclosporine and cellcept -  I called and spoke with transplant center regarding patient.  Care everywhere is down currently and they are faxing the discharge summary.    Hx of atrial fibrillation  CKD stage 3, creatinine at baseline  HLD -  Check lipid panel -  Continue crestor  Diet:  Healthy heart Access:  PIV IVF:  off Proph:  lovenox  Code Status: full Family Communication: patient and his wife Disposition Plan: Admit to telemetry at [facility]  Time spent: 60 min Janece Canterbury [facility] Pager [phone]  If 7PM-7AM, please contact night-coverage [email or web address] Password [facility] 02/14/2014, 3:34 PM

## 2014-02-14 NOTE — ED Notes (Signed)
Pt returned from MRI °

## 2014-02-14 NOTE — ED Provider Notes (Signed)
CSN: [id number]     Arrival date & time 02/14/14  1014 History   First MD Initiated Contact with Patient 02/14/14 1024     Chief Complaint  Patient presents with  . Extremity Weakness    left hand  . Dizziness     (Consider location/radiation/quality/duration/timing/severity/associated sxs/prior Treatment) HPI   The patient is a 68 y.o. year-old male with CKD stage III, hyperlipidemia, previous strokes without significant residual deficits, coronary artery disease with progressive heart failure for which he underwent heart transplant at [facility]. He is on chronic immunosuppression. He was seen at their clinic for cardiac catheterization 2 weeks ago because of progressive shortness of breath at which time he had an echocardiogram which demonstrated an ejection fraction of approximately 50%, no comment about LV thrombus. He had a cardiac catheterization which demonstrated an 80% ostial RCA stenosis which was stented with a drug-eluting stent. He was started on Plavix and discharged home in stable condition. Since returning home he has felt well. Yesterday he suddenly developed some off-balance sensation of double vision. He had difficulty walking from one room to the next without intermittently needing to use the wall for support. This morning when he awoke, he continued to have double vision and balance problems but he also noticed some numbness around his left base near his mouth, in his left hand particularly his first second and third fingers, and his left underside of his foot and toes. He denied any confusion, facial droop, weakness. His wife also did not notice any facial droop or weakness. She commenced him to come to the emergency department for evaluation.  Past Medical History  Diagnosis Date  . Stroke    Past Surgical History  Procedure Laterality Date  . Heart transplant    . Coronary angioplasty with stent placement     No family history on file. History  Substance Use  Topics  . Smoking status: Not on file  . Smokeless tobacco: Not on file  . Alcohol Use: Not on file    Review of Systems  All systems reviewed and negative, other than as noted in HPI.   Allergies  Review of patient's allergies indicates no known allergies.  Home Medications   Prior to Admission medications   Not on File   There were no vitals taken for this visit. Physical Exam  Nursing note and vitals reviewed. Constitutional: He is oriented to person, place, and time. He appears well-developed and well-nourished. No distress.  HENT:  Head: Normocephalic and atraumatic.  Eyes: Conjunctivae are normal. Right eye exhibits no discharge. Left eye exhibits no discharge.  Neck: Neck supple.  Cardiovascular: Normal rate, regular rhythm and normal heart sounds.  Exam reveals no gallop and no friction rub.   No murmur heard. Pulmonary/Chest: Effort normal and breath sounds normal. No respiratory distress.  Abdominal: Soft. He exhibits no distension. There is no tenderness.  Musculoskeletal: He exhibits no edema and no tenderness.  Neurological: He is alert and oriented to person, place, and time. No cranial nerve deficit. He exhibits normal muscle tone.  Speech clear. Content appropriate. Cranial intact. Muscle tone and bulk seems normal. 4 out of 5 strength left upper extremity. 5 out of 5 other 3 extremities. Dysmetria left upper extremity. Heel-to-shin seems normal bilaterally.  Skin: Skin is warm and dry.  Psychiatric: He has a normal mood and affect. His behavior is normal. Thought content normal.    ED Course  Procedures (including critical care time) Labs Review Labs Reviewed  BASIC METABOLIC PANEL - Abnormal; Notable for the following:    BUN 26 (*)    Creatinine, Ser 1.87 (*)    GFR calc non Af Amer 35 (*)    GFR calc Af Amer 40 (*)    All other components within normal limits  BASIC METABOLIC PANEL - Abnormal; Notable for the following:    Creatinine, Ser 1.71 (*)     GFR calc non Af Amer 39 (*)    GFR calc Af Amer 45 (*)    All other components within normal limits  LIPID PANEL - Abnormal; Notable for the following:    Triglycerides 167 (*)    HDL 39 (*)    LDL Cholesterol 113 (*)    All other components within normal limits  CBC WITH DIFFERENTIAL  HEMOGLOBIN A1C  CBC    Imaging Review No results found.  Dg Chest 2 View  02/14/2014   CLINICAL DATA:  Stroke.  History of heart transplant.  EXAM: CHEST  2 VIEW  COMPARISON:  03/05/2009.  FINDINGS: Interval median sternotomy wires and mediastinal surgical clips. Borderline enlarged cardiac silhouette. Clear lungs with normal vascularity. Mild diffuse prominence of the interstitial markings without significant change. Thoracic spine degenerative changes. Diffuse osteopenia.  IMPRESSION: No acute abnormality. Stable mild chronic interstitial lung disease.   Electronically Signed   By: Enrique Sack M.D.   On: 02/14/2014 21:21   Ct Head Wo Contrast  02/14/2014   CLINICAL DATA:  Code stroke. Yesterday afternoon is experiencing left hand weakness and numbness.  EXAM: CT HEAD WITHOUT CONTRAST  TECHNIQUE: Contiguous axial images were obtained from the base of the skull through the vertex without intravenous contrast.  COMPARISON:  CT brain 08/18/2009  FINDINGS: There is no evidence of mass effect, midline shift, or extra-axial fluid collections. There is no evidence of a space-occupying lesion or intracranial hemorrhage. There is no evidence of a cortical-based area of acute infarction. There is a large old right basal ganglia infarct with encephalomalacia. There is an old left insular infarct. There is generalized cerebral atrophy. There is periventricular white matter low attenuation likely secondary to microangiopathy.  The ventricles and sulci are appropriate for the patient's age. The basal cisterns are patent.  Visualized portions of the orbits are unremarkable. The visualized portions of the paranasal sinuses  and mastoid air cells are unremarkable. Cerebrovascular atherosclerotic calcifications are noted.  The osseous structures are unremarkable.  IMPRESSION: No acute intracranial pathology.  These results were called by telephone at the time of interpretation on 02/14/2014 at 10:50 am to Dr. Virgel Manifold , who verbally acknowledged these results.   Electronically Signed   By: Kathreen Devoid   On: 02/14/2014 10:52   Mr [name] Head Wo Contrast  02/14/2014   CLINICAL DATA:  Acute onset left facial numbness and left arm weakness. Coronary stent placed 02/04/2014  EXAM: MRI HEAD WITHOUT CONTRAST  MRA HEAD WITHOUT CONTRAST  TECHNIQUE: Multiplanar, multiecho pulse sequences of the brain and surrounding structures were obtained without intravenous contrast. Angiographic images of the head were obtained using MRA technique without contrast.  COMPARISON:  CT 02/14/2014.  MRI 08/18/2009  FINDINGS: MRI HEAD FINDINGS  Acute infarct right thalamus measures 8 x 10 mm. No other area of acute infarct.  Chronic hemorrhagic infarct right external capsule unchanged. Chronic infarct in the left insula. Chronic microvascular ischemia in the white matter. Brainstem and cerebellum intact.  Generalized atrophy.  Sub cm areas of susceptibility in the left frontal subarachnoid space may  represent a small area of chronic hemorrhage. This was not present previously.  MRA HEAD FINDINGS  Both vertebral arteries patent to the basilar. PICA patent bilaterally. Basilar widely patent. Right superior cerebellar artery patent. Left superior cerebellar artery poorly visualized and may be stenotic. Posterior cerebral arteries patent bilaterally.  Internal carotid artery patent bilaterally without stenosis. Anterior and middle cerebral artery patent bilaterally.  Negative for cerebral aneurysm.  IMPRESSION: Acute infarct right thalamus.  No other acute infarct  Atrophy and chronic ischemic change.  Possible stenosis left superior cerebellar artery. Otherwise  negative MRA head.   Electronically Signed   By: Franchot Gallo M.D.   On: 02/14/2014 12:41   Mr Brain Wo Contrast  02/14/2014   CLINICAL DATA:  Acute onset left facial numbness and left arm weakness. Coronary stent placed 02/04/2014  EXAM: MRI HEAD WITHOUT CONTRAST  MRA HEAD WITHOUT CONTRAST  TECHNIQUE: Multiplanar, multiecho pulse sequences of the brain and surrounding structures were obtained without intravenous contrast. Angiographic images of the head were obtained using MRA technique without contrast.  COMPARISON:  CT 02/14/2014.  MRI 08/18/2009  FINDINGS: MRI HEAD FINDINGS  Acute infarct right thalamus measures 8 x 10 mm. No other area of acute infarct.  Chronic hemorrhagic infarct right external capsule unchanged. Chronic infarct in the left insula. Chronic microvascular ischemia in the white matter. Brainstem and cerebellum intact.  Generalized atrophy.  Sub cm areas of susceptibility in the left frontal subarachnoid space may represent a small area of chronic hemorrhage. This was not present previously.  MRA HEAD FINDINGS  Both vertebral arteries patent to the basilar. PICA patent bilaterally. Basilar widely patent. Right superior cerebellar artery patent. Left superior cerebellar artery poorly visualized and may be stenotic. Posterior cerebral arteries patent bilaterally.  Internal carotid artery patent bilaterally without stenosis. Anterior and middle cerebral artery patent bilaterally.  Negative for cerebral aneurysm.  IMPRESSION: Acute infarct right thalamus.  No other acute infarct  Atrophy and chronic ischemic change.  Possible stenosis left superior cerebellar artery. Otherwise negative MRA head.   Electronically Signed   By: Franchot Gallo M.D.   On: 02/14/2014 12:41    EKG Interpretation   Date/Time:  Thursday February 14 2014 10:23:26 EDT Ventricular Rate:  82 PR Interval:  172 QRS Duration: 107 QT Interval:  410 QTC Calculation: 479 R Axis:   153 Text Interpretation:  Sinus rhythm  Probable lateral infarct, age  indeterminate Baseline wander in lead(s) II III aVF ED PHYSICIAN  INTERPRETATION AVAILABLE IN [facility] Confirmed by TEST, Record  ([id number]) on 02/16/2014 12:48:07 PM      MDM   Final diagnoses:  Acute CVA (cerebrovascular accident)    68 year old male with acute right thalamic stroke. Symptoms stable since onset. Admission for further evaluation.    Virgel Manifold, MD 02/20/14 1043

## 2014-02-14 NOTE — Progress Notes (Signed)
Patient trans in from [facility] ED at 1900, Alert and oriented. Placed on cardiac monitor and bed alarm. Call bell placed within reach and oriented him to the room.

## 2014-02-15 DIAGNOSIS — Z941 Heart transplant status: Secondary | ICD-10-CM

## 2014-02-15 DIAGNOSIS — I6309 Cerebral infarction due to thrombosis of other precerebral artery: Secondary | ICD-10-CM

## 2014-02-15 LAB — CBC
HCT: 41.5 % (ref 39.0–52.0)
Hemoglobin: 13.6 g/dL (ref 13.0–17.0)
MCH: 29.7 pg (ref 26.0–34.0)
MCHC: 32.8 g/dL (ref 30.0–36.0)
MCV: 90.6 fL (ref 78.0–100.0)
Platelets: 262 10*3/uL (ref 150–400)
RBC: 4.58 MIL/uL (ref 4.22–5.81)
RDW: 13.5 % (ref 11.5–15.5)
WBC: 6.2 10*3/uL (ref 4.0–10.5)

## 2014-02-15 LAB — LIPID PANEL
Cholesterol: 185 mg/dL (ref 0–200)
HDL: 39 mg/dL — ABNORMAL LOW (ref >39)
LDL CALC: 113 mg/dL — AB (ref 0–99)
Total CHOL/HDL Ratio: 4.7 RATIO
Triglycerides: 167 mg/dL — ABNORMAL HIGH (ref <150)
VLDL: 33 mg/dL (ref 0–40)

## 2014-02-15 LAB — BASIC METABOLIC PANEL
Anion gap: 14 (ref 5–15)
BUN: 21 mg/dL (ref 6–23)
CALCIUM: 8.9 mg/dL (ref 8.4–10.5)
CO2: 22 mEq/L (ref 19–32)
CREATININE: 1.71 mg/dL — AB (ref 0.50–1.35)
Chloride: 102 mEq/L (ref 96–112)
GFR, EST AFRICAN AMERICAN: 45 mL/min — AB (ref >90)
GFR, EST NON AFRICAN AMERICAN: 39 mL/min — AB (ref >90)
GLUCOSE: 95 mg/dL (ref 70–99)
Potassium: 4.3 mEq/L (ref 3.7–5.3)
Sodium: 138 mEq/L (ref 137–147)

## 2014-02-15 LAB — HEMOGLOBIN A1C
Hgb A1c MFr Bld: 5.2 % (ref <5.7)
Mean Plasma Glucose: 103 mg/dL (ref <117)

## 2014-02-15 MED ORDER — HYDRALAZINE HCL 50 MG PO TABS: 100.0000 mg | ORAL_TABLET | Freq: Three times a day (TID) | ORAL | Status: DC

## 2014-02-15 MED ORDER — UNABLE TO FIND: Status: DC

## 2014-02-15 MED ORDER — CALCIUM-VITAMIN D 500-200 MG-UNIT PO TABS: 1.0000 | ORAL_TABLET | Freq: Every day | ORAL | Status: DC

## 2014-02-15 MED ORDER — AMLODIPINE BESYLATE 5 MG PO TABS: 5.0000 mg | ORAL_TABLET | Freq: Every day | ORAL | Status: DC

## 2014-02-15 MED ORDER — CALCIUM CARBONATE-VITAMIN D 500-200 MG-UNIT PO TABS: 1.0000 | ORAL_TABLET | Freq: Every day | ORAL | Status: DC

## 2014-02-15 NOTE — Evaluation (Signed)
Physical Therapy Evaluation Patient Details Name: Adam Waters MRN: [id number] DOB: 06/10/1945 Today's Date: 02/15/2014   History of Present Illness  Adam Waters is a 68 y.o. Male admitted 02/14/14 with L sided numbness, double vision, and dizziness. PMH of CVA, CHF, heart transplant, CAD, A fib, CKD stage III, and coronary angioplasty with stent (02/04/14). MRI on 02/14/14 showed acute infarct in Right thalamus and possible stenosis of left superior cerebellar artery.    Clinical Impression  Pt is mobilizing pretty close to his baseline, however, he is very unsteady at baseline.  He has left foot drag and functional left leg weakness that causes him to stub his toe and have decreased dynamic balance during gait.  He would benefit from going through OP PT balance and gait training program to get himself more steady (especially as he reports to me he still uses a chainsaw in the yard), but he is currently very occupied by his wife's new dx of lung CA and he takes her to her chemo and radiation therapy.  I gave him verbal information on how to seek out OP PT at a later date.  He agreed if the problem got worse, he would seek out therapy.   PT to follow acutely for deficits listed below.       Follow Up Recommendations Outpatient PT;Other (comment) (for balance and gait training)    Equipment Recommendations  None recommended by PT    Recommendations for Other Services   NA     Precautions / Restrictions Precautions Precautions: Fall Precaution Comments: pt is unsteady on his feet at baseline.       Mobility   Transfers Overall transfer level: Modified independent Equipment used: None             General transfer comment: Pt relies on hands for transitions.  Ambulation/Gait Ambulation/Gait assistance: Min guard Ambulation Distance (Feet): 250 Feet Assistive device: None Gait Pattern/deviations: Step-through pattern;Decreased weight shift to left;Decreased dorsiflexion -  left;Decreased step length - left;Staggering left;Staggering right Gait velocity: decreased Gait velocity interpretation: Below normal speed for age/gender General Gait Details: Pt reports that the left foot drop seen during gait is his normal "since my first stroke" and that he also has a left knee injury that required surgery on this side "a horse fell on it"  Stairs Stairs: Yes Stairs assistance: Min guard Stair Management: One rail Right;Alternating pattern;Forwards Number of Stairs: 9 General stair comments: Verbal cues to go slower and make sure his left foot was making good contact with the step (left foot tends to only get half way on the step). Min guard assist for one LOB.  Pt states " I have to use the rail, because I just don't know when my left knee is going to give out on me"      Modified Rankin (Stroke Patients Only) Modified Rankin (Stroke Patients Only) Pre-Morbid Rankin Score: No significant disability Modified Rankin: Slight disability     Balance Overall balance assessment: Needs assistance Sitting-balance support: Feet supported;No upper extremity supported Sitting balance-Leahy Scale: Good     Standing balance support: No upper extremity supported Standing balance-Leahy Scale: Good Standing balance comment: Pt does have evident deficits, but he compensates for them and has not had a fall to the ground in the past 6 months.                  Standardized Balance Assessment Standardized Balance Assessment : Dynamic Gait Index   Dynamic Gait Index  Level Surface: Mild Impairment Change in Gait Speed: Normal Gait with Horizontal Head Turns: Mild Impairment Gait with Vertical Head Turns: Mild Impairment Gait and Pivot Turn: Normal Step Over Obstacle: Moderate Impairment Step Around Obstacles: Mild Impairment Steps: Mild Impairment Total Score: 17       Pertinent Vitals/Pain Pain Assessment: No/denies pain    Home Living Family/patient expects  to be discharged to:: Private residence Living Arrangements: Spouse/significant other Available Help at Discharge: Family;Available 24 hours/day Type of Home: House Home Access: Stairs to enter Entrance Stairs-Rails: Right;Left Entrance Stairs-Number of Steps: ~20 Home Layout: Two level;Able to live on main level with bedroom/bathroom (man cave in basement) Home Equipment: None      Prior Function Level of Independence: Independent         Comments: Pt is independent and lives on 15 acres of land, takes care of his property     Hand Dominance   Dominant Hand: Right    Extremity/Trunk Assessment   Upper Extremity Assessment: Defer to OT evaluation           Lower Extremity Assessment: LLE deficits/detail   LLE Deficits / Details: When MMT both legs seemed equal and 5/5 throughout, however, functionally, pt does have some left foot drag and decreased left foot step length indicating some functional weakness.  Pt reports this is his baseline since "my first stroke" and a left knee injury that required surgery "a horse fell on it"  Cervical / Trunk Assessment: Normal  Communication   Communication: No difficulties  Cognition Arousal/Alertness: Awake/alert Behavior During Therapy: WFL for tasks assessed/performed Overall Cognitive Status: Within Functional Limits for tasks assessed                      General Comments General comments (skin integrity, edema, etc.): I educated pt that he was a fall risk and that it would be wise if he would use a cane for community ambulation and/or outdoor ambulation as these are places where he would be more off balance.  He then stated to me, "but how am I going to use a chainsaw if I am carrying a cane"  I do not think he was kidding.           Assessment/Plan    PT Assessment Patient needs continued PT services  PT Diagnosis Difficulty walking;Abnormality of gait;Generalized weakness   PT Problem List Decreased  strength;Decreased activity tolerance;Decreased balance;Decreased mobility;Decreased knowledge of use of DME  PT Treatment Interventions DME instruction;Gait training;Stair training;Functional mobility training;Therapeutic activities;Therapeutic exercise;Balance training;Neuromuscular re-education;Patient/family education   PT Goals (Current goals can be found in the Care Plan section) Acute Rehab PT Goals Patient Stated Goal: to go home and care for his wife who is recieving cancer treatment.  PT Goal Formulation: With patient Time For Goal Achievement: 03/01/14 Potential to Achieve Goals: Good    Frequency Min 4X/week   Barriers to discharge Decreased caregiver support pt's wife is starting chemo and radiation for lung cancer       End of Session Equipment Utilized During Treatment: Gait belt Activity Tolerance: Patient tolerated treatment well Patient left: in chair;with call bell/phone within reach           Time: 1136-1155 PT Time Calculation (min): 19 min   Charges:   PT Evaluation $Initial PT Evaluation Tier I: 1 Procedure PT Treatments $Gait Training: 8-22 mins        Micahel Omlor B. Toni Demo, PT, DPT [phone]   02/15/2014, 3:36 PM

## 2014-02-15 NOTE — Evaluation (Signed)
Speech Language Pathology Evaluation Patient Details Name: Adam Waters MRN: [id number] DOB: May 27, 1945 Today's Date: 02/15/2014 Time: 1100-1130 SLP Time Calculation (min): 30 min  Problem List:  Patient Active Problem List   Diagnosis Date Noted  . Acute ischemic stroke 02/14/2014  . Stroke   . Heart transplanted   . CAD (coronary artery disease), native artery transplanted heart   . Chronic kidney disease, stage 3   . Hyperlipidemia    Past Medical History:  Past Medical History  Diagnosis Date  . Stroke     2 strokes w/in 24 hrs of cardioversion  . Heart failure   . Heart transplanted   . CAD (coronary artery disease), native artery transplanted heart   . Atrial fibrillation   . Chronic kidney disease, stage 3   . Hyperlipidemia   . Insomnia   . Skin cancer    Past Surgical History:  Past Surgical History  Procedure Laterality Date  . Heart transplant    . Coronary angioplasty with stent placement  01/2014    in transplanted heart at [facility]  . Knee surgery Left    HPI:  The patient is a 68 y.o. year-old male with CKD stage III, hyperlipidemia, previous strokes without significant residual deficits, coronary artery disease with progressive heart failure for which he underwent heart transplant at [facility].  He is on chronic immunosuppression. He was seen at their clinic for cardiac catheterization 2 weeks ago because of progressive shortness of breath at which time he had an echocardiogram which demonstrated an ejection fraction of approximately 50%, no comment about LV thrombus. He had a cardiac catheterization which demonstrated an 80% ostial RCA stenosis which was stented with a drug-eluting stent. He was started on Plavix and discharged home in stable condition.  Since returning home he has felt well. Yesterday he suddenly developed some off-balance sensation of double vision. He had difficulty walking from one room to the next without intermittently needing to use  the wall for support. This morning when he awoke, he continued to have double vision and balance problems but he also noticed some numbness around his left base near his mouth, in his left hand particularly his first second and third fingers, and his left underside of his foot and toes. He denied any confusion, facial droop, weakness. His wife also did not notice any facial droop or weakness. She commenced him to come to the emergency department for evaluation.   Assessment / Plan / Recommendation Clinical Impression  Patient appears to be within normal limits for cognitive-linguistic abilities. No deficits noted in memory, reasoning, problem solving, safety awareness. Patient stated that he had residual weakness with left side (mainly with fine motor control of left hand) since his previous CVA, and now he feels that this current stroke has "added to it". No facial droop or impact on his speech at word, conversational  levels, though he does present with a very mild left labial weakness.     SLP Assessment  Patient does not need any further Speech Lanaguage Pathology Services    Follow Up Recommendations    N/A   Frequency and Duration   N/A     Pertinent Vitals/Pain Pain Assessment: No/denies pain   SLP Goals  Patient/Family Stated Goal: patient concerned because his wife is currently getting chemo treatment and he doesn't like her to have to be driving  SLP Evaluation Prior Functioning  Cognitive/Linguistic Baseline: Within functional limits Type of Home: House Available Help at Discharge: Family;Available 24  hours/day   Cognition  Overall Cognitive Status: Within Functional Limits for tasks assessed Arousal/Alertness: Awake/alert Orientation Level: Oriented X4 Attention: Selective Selective Attention: Appears intact Memory: Appears intact Awareness: Appears intact Problem Solving: Appears intact Executive Function: Reasoning;Organizing;Decision Making Reasoning: Appears  intact Organizing: Appears intact Decision Making: Appears intact Safety/Judgment: Appears intact    Comprehension  Auditory Comprehension Overall Auditory Comprehension: Appears within functional limits for tasks assessed    Expression Expression Primary Mode of Expression: Verbal Verbal Expression Overall Verbal Expression: Appears within functional limits for tasks assessed Written Expression Dominant Hand: Right   Oral / Motor Oral Motor/Sensory Function Overall Oral Motor/Sensory Function: Appears within functional limits for tasks assessed Motor Speech Overall Motor Speech: Appears within functional limits for tasks assessed   GO     Adam Waters 02/15/2014, 5:33 PM   Adam Baller, MA, CCC-SLP [facility] Speech-Language Pathologist

## 2014-02-15 NOTE — Discharge Summary (Addendum)
Physician Discharge Summary  Patient ID: Adam Waters MRN: [id number] DOB/AGE: 06/25/1945 68 y.o.  Admit date: 02/14/2014 Discharge date: 02/15/2014  Primary Care Physician:  Asencion Noble, MD  Discharge Diagnoses:    . Acute ischemic stroke . CAD (coronary artery disease), native artery transplanted heart . Chronic kidney disease, stage 3 . Hyperlipidemia   Consults: Neurology, Dr. Leonie Man   Recommendations for Outpatient Follow-up:  Continue ongoing risk factor control by PCP  Allergies:  No Known Allergies   Discharge Medications:   Medication List         amLODipine 5 MG tablet  Commonly known as:  NORVASC  Take 5 mg by mouth daily.     aspirin 81 MG tablet  Take 162 mg by mouth every evening.     calcium-vitamin D 500-200 MG-UNIT per tablet  Take 1 tablet by mouth daily.     CELLCEPT 500 MG tablet  Generic drug:  mycophenolate  Take 1,000 mg by mouth 2 (two) times daily.     clonazePAM 0.5 MG tablet  Commonly known as:  KLONOPIN  Take 0.5 mg by mouth 3 times/day as needed-between meals & bedtime for anxiety (sleep).     clopidogrel 75 MG tablet  Commonly known as:  PLAVIX  Take 75 mg by mouth daily.     furosemide 20 MG tablet  Commonly known as:  LASIX  Take 20 mg by mouth daily as needed for fluid or edema.     GENGRAF 25 MG capsule  Generic drug:  cycloSPORINE modified  Take 75 mg by mouth 2 (two) times daily.     hydrALAZINE 50 MG tablet  Commonly known as:  APRESOLINE  Take 100 mg by mouth 3 (three) times daily.     rosuvastatin 5 MG tablet  Commonly known as:  CRESTOR  Take 5 mg by mouth daily.     UNABLE TO FIND  - Outpatient Physical therapy  -   -   - Diagnosis: Acute stroke         Brief H and P: For complete details please refer to admission H and P, but in brief HPI:  The patient is a 68 y.o. year-old male with CKD stage III, hyperlipidemia, previous strokes without significant residual deficits, coronary artery disease  with progressive heart failure for which he underwent heart transplant at [facility]. He is on chronic immunosuppression. He was seen at their clinic for cardiac catheterization 2 weeks ago because of progressive shortness of breath at which time he had an echocardiogram which demonstrated an ejection fraction of approximately 50%, no comment about LV thrombus. He had a cardiac catheterization which demonstrated an 80% ostial RCA stenosis which was stented with a drug-eluting stent. He was started on Plavix and discharged home in stable condition. Since returning home he has felt well. Yesterday he suddenly developed some off-balance sensation of double vision. He had difficulty walking from one room to the next without intermittently needing to use the wall for support. This morning when he awoke, he continued to have double vision and balance problems but he also noticed some numbness around his left base near his mouth, in his left hand particularly his first second and third fingers, and his left underside of his foot and toes. He denied any confusion, facial droop, weakness. His wife also did not notice any facial droop or weakness. She commenced him to come to the emergency department for evaluation.  In the emergency department, vital signs are stable, labs were  notable for creatinine of 1.87. MRI of the brain demonstrated an acute right thalamic infarct of 8 x 10 mm, no other infarct. He was found to have atrophy and chronic ischemic changes. There is possible stenosis of the left superior cerebellar artery, otherwise his MRA of the head was negative. Neurology was notified by the emergency department physician and requested that the patient be admitted to [facility] so that the stroke team may continue to monitor the patient.      Hospital Course:   Acute ischemic stroke: patient had presented with some off-balance sensation and double vision.  MRI showed acute right thalamic infarct  of 8 x 10 mm, no other infarct, atrophy and chronic ischemic changes, possible stenosis of left superior cerebellar artery. Neurology was consulted and recommended patient be admitted to the [facility] for stroke workup. MRA negative for large vessel intracranial occlusion.  The patient had a 2-D echo done outpatient 2 weeks ago at [facility] which had shown EF of approximately 50% with no comment about LV thrombus. He had a cardiac catheterization 2 weeks ago which demonstrated an 80% ostial RCA stenosis which was stented with a drug-eluting stent. Carotid Dopplers showed 1-39% ICA stenosis. Patient was recommended to continue aspirin, Plavix and statin and no changes at this time PTOT evaluation was done and recommended outpatient PT.  Heart transplanted  - Continue cyclosporine and CellCept   CAD (coronary artery disease), native artery transplanted heart  - Continue aspirin and plavix  - Continue statin   Chronic kidney disease, stage 3-creatinine baseline   Hyperlipidemia  - Continue statin, LDL 113    history of atrial fibrillation pre-transplant. Continue aspirin and Plavix. Per neurology, no anticoagulation secondary to transplanted heart without atrial fibrillation.   Day of Discharge BP 138/94  Pulse 85  Temp(Src) 98.6 F (37 C) (Axillary)  Resp 20  Ht 5' 10.8" (1.798 m)  Wt 79.379 kg (175 lb)  BMI 24.55 kg/m2  SpO2 100%  Physical Exam: General: Alert and awake oriented x3 not in any acute distress. HEENT: anicteric sclera, pupils reactive to light and accommodation CVS: S1-S2 clear no murmur rubs or gallops Chest: clear to auscultation bilaterally, no wheezing rales or rhonchi Abdomen: soft nontender, nondistended, normal bowel sounds Extremities: no cyanosis, clubbing or edema noted bilaterally    The results of significant diagnostics from this hospitalization (including imaging, microbiology, ancillary and laboratory) are listed below for  reference.    LAB RESULTS: Basic Metabolic Panel:  Recent Labs Lab 02/14/14 1028 02/15/14 0435  NA 137 138  K 4.3 4.3  CL 100 102  CO2 23 22  GLUCOSE 94 95  BUN 26* 21  CREATININE 1.87* 1.71*  CALCIUM 9.2 8.9   CBC:  Recent Labs Lab 02/14/14 1028 02/15/14 0435  WBC 7.2 6.2  NEUTROABS 4.8  --   HGB 14.2 13.6  HCT 42.7 41.5  MCV 90.7 90.6  PLT 283 262   Cardiac Enzymes: No results found for this basename: CKTOTAL, CKMB, CKMBINDEX, TROPONINI,  in the last 168 hours BNP: No components found with this basename: POCBNP,  CBG: No results found for this basename: GLUCAP,  in the last 168 hours  Significant Diagnostic Studies:  Dg Chest 2 View  02/14/2014   CLINICAL DATA:  Stroke.  History of heart transplant.  EXAM: CHEST  2 VIEW  COMPARISON:  03/05/2009.  FINDINGS: Interval median sternotomy wires and mediastinal surgical clips. Borderline enlarged cardiac silhouette. Clear lungs with normal vascularity. Mild  diffuse prominence of the interstitial markings without significant change. Thoracic spine degenerative changes. Diffuse osteopenia.  IMPRESSION: No acute abnormality. Stable mild chronic interstitial lung disease.   Electronically Signed   By: Enrique Sack M.D.   On: 02/14/2014 21:21   Ct Head Wo Contrast  02/14/2014   CLINICAL DATA:  Code stroke. Yesterday afternoon is experiencing left hand weakness and numbness.  EXAM: CT HEAD WITHOUT CONTRAST  TECHNIQUE: Contiguous axial images were obtained from the base of the skull through the vertex without intravenous contrast.  COMPARISON:  CT brain 08/18/2009  FINDINGS: There is no evidence of mass effect, midline shift, or extra-axial fluid collections. There is no evidence of a space-occupying lesion or intracranial hemorrhage. There is no evidence of a cortical-based area of acute infarction. There is a large old right basal ganglia infarct with encephalomalacia. There is an old left insular infarct. There is generalized  cerebral atrophy. There is periventricular white matter low attenuation likely secondary to microangiopathy.  The ventricles and sulci are appropriate for the patient's age. The basal cisterns are patent.  Visualized portions of the orbits are unremarkable. The visualized portions of the paranasal sinuses and mastoid air cells are unremarkable. Cerebrovascular atherosclerotic calcifications are noted.  The osseous structures are unremarkable.  IMPRESSION: No acute intracranial pathology.  These results were called by telephone at the time of interpretation on 02/14/2014 at 10:50 am to Dr. Virgel Manifold , who verbally acknowledged these results.   Electronically Signed   By: Kathreen Devoid   On: 02/14/2014 10:52   Mr [name] Head Wo Contrast  02/14/2014   CLINICAL DATA:  Acute onset left facial numbness and left arm weakness. Coronary stent placed 02/04/2014  EXAM: MRI HEAD WITHOUT CONTRAST  MRA HEAD WITHOUT CONTRAST  TECHNIQUE: Multiplanar, multiecho pulse sequences of the brain and surrounding structures were obtained without intravenous contrast. Angiographic images of the head were obtained using MRA technique without contrast.  COMPARISON:  CT 02/14/2014.  MRI 08/18/2009  FINDINGS: MRI HEAD FINDINGS  Acute infarct right thalamus measures 8 x 10 mm. No other area of acute infarct.  Chronic hemorrhagic infarct right external capsule unchanged. Chronic infarct in the left insula. Chronic microvascular ischemia in the white matter. Brainstem and cerebellum intact.  Generalized atrophy.  Sub cm areas of susceptibility in the left frontal subarachnoid space may represent a small area of chronic hemorrhage. This was not present previously.  MRA HEAD FINDINGS  Both vertebral arteries patent to the basilar. PICA patent bilaterally. Basilar widely patent. Right superior cerebellar artery patent. Left superior cerebellar artery poorly visualized and may be stenotic. Posterior cerebral arteries patent bilaterally.  Internal  carotid artery patent bilaterally without stenosis. Anterior and middle cerebral artery patent bilaterally.  Negative for cerebral aneurysm.  IMPRESSION: Acute infarct right thalamus.  No other acute infarct  Atrophy and chronic ischemic change.  Possible stenosis left superior cerebellar artery. Otherwise negative MRA head.   Electronically Signed   By: Franchot Gallo M.D.   On: 02/14/2014 12:41   Mr Brain Wo Contrast  02/14/2014   CLINICAL DATA:  Acute onset left facial numbness and left arm weakness. Coronary stent placed 02/04/2014  EXAM: MRI HEAD WITHOUT CONTRAST  MRA HEAD WITHOUT CONTRAST  TECHNIQUE: Multiplanar, multiecho pulse sequences of the brain and surrounding structures were obtained without intravenous contrast. Angiographic images of the head were obtained using MRA technique without contrast.  COMPARISON:  CT 02/14/2014.  MRI 08/18/2009  FINDINGS: MRI HEAD FINDINGS  Acute infarct right  thalamus measures 8 x 10 mm. No other area of acute infarct.  Chronic hemorrhagic infarct right external capsule unchanged. Chronic infarct in the left insula. Chronic microvascular ischemia in the white matter. Brainstem and cerebellum intact.  Generalized atrophy.  Sub cm areas of susceptibility in the left frontal subarachnoid space may represent a small area of chronic hemorrhage. This was not present previously.  MRA HEAD FINDINGS  Both vertebral arteries patent to the basilar. PICA patent bilaterally. Basilar widely patent. Right superior cerebellar artery patent. Left superior cerebellar artery poorly visualized and may be stenotic. Posterior cerebral arteries patent bilaterally.  Internal carotid artery patent bilaterally without stenosis. Anterior and middle cerebral artery patent bilaterally.  Negative for cerebral aneurysm.  IMPRESSION: Acute infarct right thalamus.  No other acute infarct  Atrophy and chronic ischemic change.  Possible stenosis left superior cerebellar artery. Otherwise negative MRA  head.   Electronically Signed   By: Franchot Gallo M.D.   On: 02/14/2014 12:41       Disposition and Follow-up:     Discharge Instructions   Diet - low sodium heart healthy    Complete by:  As directed      Increase activity slowly    Complete by:  As directed             DISPOSITION:Home  DIET:Heart healthy   DISCHARGE FOLLOW-UP Follow-up Information   Follow up with Asencion Noble, MD. Schedule an appointment as soon as possible for a visit in 2 weeks. (for hospital follow-up)    Specialty:  Internal Medicine   Contact information:   [place] [address] [address] [phone]       Follow up with SETHI,PRAMOD, MD. Schedule an appointment as soon as possible for a visit in 2 months. (for hospital follow-up, stroke clinic)    Specialties:  Neurology, Radiology   Contact information:   [address] [place] [address] [phone]       Time spent on Discharge: 37 minutes  Signed:   Elke Holtry M.D. [facility] 02/15/2014, 5:02 PM Pager: [phone]

## 2014-02-15 NOTE — Progress Notes (Signed)
Occupational Therapy Evaluation Patient Details Name: SYLUS STGERMAIN MRN: [id number] DOB: March 19, 1946 Today's Date: 02/15/2014    History of Present Illness Chamberlain Steinborn. Froberg is a 68 y.o. Male admitted 02/14/14 with L sided numbness, double vision, and dizziness. PMH of CVA, CHF, heart transplant, CAD, A fib, CKD stage III, and coronary angioplasty with stent (02/04/14). MRI on 02/14/14 showed acute infarct in Right thalamus and possible stenosis of left superior cerebellar artery.     Clinical Impression   PTA pt lived at home with his wife and was independent with ADLs and IADLs. Pt with stent placement earlier this month. Pt currently at Supervision level for ADLs and functional mobility and reports feeling "wobbly" during ambulation. Reports symptoms have occurred several times now and consistently resolve. No further acute OT needs at this time.     Follow Up Recommendations  No OT follow up;Supervision - Intermittent    Equipment Recommendations  None recommended by OT    Recommendations for Other Services       Precautions / Restrictions Restrictions Weight Bearing Restrictions: No      Mobility Bed Mobility               General bed mobility comments: Pt sitting up in recliner.   Transfers Overall transfer level: Modified independent Equipment used: None                       ADL Overall ADL's : Needs assistance/impaired Eating/Feeding: Independent;Sitting   Grooming: Supervision/safety;Standing   Upper Body Bathing: Supervision/ safety;Sitting   Lower Body Bathing: Supervison/ safety;Sit to/from stand   Upper Body Dressing : Supervision/safety;Sitting   Lower Body Dressing: Supervision/safety;Sit to/from stand   Toilet Transfer: Supervision/safety;Ambulation           Functional mobility during ADLs: Supervision/safety General ADL Comments: Pt overall at Supervision level for ADLs and functional mobility. Pt does report feeling "wobbly"  during ambulation, however had no LOB during ambulation around room or hallway. Pt did note that he has felt dizziness recently when bending over to tie his shoes. Pt's symptoms have resolved with the exception of numbness in left thumb. Educated pt on safety with numbness using sharp or hot objects.      Vision  Pt reports return to baseline.                Additional Comments: Pt reports that he has had episodes of double vision and he had it mildly during this episode, however it has fully resolved. Vision screened and WFL.    Perception Perception Perception Tested?: No   Praxis Praxis Praxis tested?: Within functional limits    Pertinent Vitals/Pain Pain Assessment: No/denies pain     Hand Dominance Right   Extremity/Trunk Assessment Upper Extremity Assessment Upper Extremity Assessment: Overall WFL for tasks assessed;LUE deficits/detail LUE Deficits / Details: "pins and needles" mild in left thumb; resolved otherwise. Strength WFL throughout   Lower Extremity Assessment Lower Extremity Assessment: Overall WFL for tasks assessed   Cervical / Trunk Assessment Cervical / Trunk Assessment: Normal   Communication Communication Communication: No difficulties   Cognition Arousal/Alertness: Awake/alert Behavior During Therapy: WFL for tasks assessed/performed Overall Cognitive Status: Within Functional Limits for tasks assessed                                Home Living Family/patient expects to be discharged to:: Private residence Living Arrangements: Spouse/significant other  Available Help at Discharge: Family;Available 24 hours/day Type of Home: House Home Access: Stairs to enter [employer] of Steps: ~20 Entrance Stairs-Rails: Right;Left Home Layout: Two level;Able to live on main level with bedroom/bathroom (man cave in basement)     Bathroom Shower/Tub: Tub/shower unit Shower/tub characteristics: Architectural technologist: Standard      Home Equipment: None          Prior Functioning/Environment Level of Independence: Independent        Comments: Pt is independent and lives on 15 acres of land, takes care of his property    OT Diagnosis: Generalized weakness;Disturbance of vision                          End of Session Equipment Utilized During Treatment: Gait belt  Activity Tolerance: Patient tolerated treatment well Patient left: in chair;with call bell/phone within reach   Time: 0905-0959 OT Time Calculation (min): 54 min Charges:  OT General Charges $OT Visit: 1 Procedure OT Evaluation $Initial OT Evaluation Tier I: 1 Procedure OT Treatments $Self Care/Home Management : 23-37 mins  Juluis Rainier 02/15/2014, 11:00 AM  Cyndie Chime, OTR/L Occupational Therapist [phone] (pager)

## 2014-02-15 NOTE — Progress Notes (Signed)
Pt is being discharged home. Discharge instructions were given to patient and family 

## 2014-02-15 NOTE — Progress Notes (Signed)
Request for medical records faxed to [facility]-called to confirm that request was received.

## 2014-02-15 NOTE — Progress Notes (Signed)
*  PRELIMINARY RESULTS* Vascular Ultrasound Carotid Duplex (Doppler) has been completed.   Findings suggest 1-39% internal carotid artery stenosis bilaterally. Vertebral arteries are patent with antegrade flow.  02/15/2014 4:02 PM Maudry Mayhew, RVT, RDCS, RDMS

## 2014-02-15 NOTE — Progress Notes (Signed)
Patient ID: Adam Waters  male  UTM:[id number]    DOB: Oct 06, 1945    DOA: 02/14/2014  PCP: Asencion Noble, MD  Brief history of present illness The patient is a 68 y.o. year-old male with CKD stage III, hyperlipidemia, previous strokes without significant residual deficits, coronary artery disease with progressive heart failure for which he underwent heart transplant at [facility]. He is on chronic immunosuppression. He was seen at their clinic for cardiac cath 2 weeks ago because of progressive shortness of breath at which time he had an echocardiogram which demonstrated an ejection fraction of approximately 50%, no comment about LV thrombus. He had a cardiac catheterization which demonstrated an 80% ostial RCA stenosis which was stented with a drug-eluting stent. He was started on Plavix and discharged home in stable condition. He was in baseline state of health until the day before the admission, he suddenly developed some off-balance sensation of double vision.  On the morning when he awoke, he continued to have double vision and balance problems but he also noticed some numbness around his left base near his mouth, in his left hand particularly his first second and third fingers, and his left underside of his foot and toes.   Assessment/Plan: Principal Problem:   Acute ischemic stroke - MRI showed acute right thalamic infarct of 8 x 10 mm, no other infarct, atrophy and chronic ischemic changes, possible stenosis of left superior cerebellar artery - Carotid Dopplers pending, neurology consulted, and follow stroke service recommendations - Patient reported 2-D echocardiogram done at [facility], 2 weeks ago, awaiting records. - Continue aspirin and Plavix, statin - PT, OT evaluation  Active Problems:   Heart transplanted - Continue cyclosporine and CellCept    CAD (coronary artery disease), native artery transplanted heart - Continue aspirin and plavix  - Continue statin     Chronic kidney  disease, stage 3-creatinine baseline     Hyperlipidemia - Continue statin, LDL 113    DVT Prophylaxis:  Code Status: full code   Family Communication: no family member at the bedside, and discussed with the patient   per patient, his wife is currently admitted at [facility]   Disposition:  Consultants:   neurology   Procedures:  MRI, MRA  Antibiotics:   none   Subjective:  patient seen and examined, symptoms improving, no acute complaints   Objective: Weight change:  No intake or output data in the 24 hours ending 02/15/14 1039 Blood pressure 164/95, pulse 84, temperature 97.9 F (36.6 C), temperature source Oral, resp. rate 20, SpO2 100.00%.  Physical Exam: General: Alert and awake, oriented x3, not in any acute distress. CVS: S1-S2 clear, no murmur rubs or gallops Chest: clear to auscultation bilaterally, no wheezing, rales or rhonchi Abdomen: soft nontender, nondistended, normal bowel sounds  Extremities: no cyanosis, clubbing or edema noted bilaterally Neuro:  Strength 4/ 5 in left upper extremity otherwise 5/5 in bilateral lower extremities   Lab Results: Basic Metabolic Panel:  Recent Labs Lab 02/14/14 1028 02/15/14 0435  NA 137 138  K 4.3 4.3  CL 100 102  CO2 23 22  GLUCOSE 94 95  BUN 26* 21  CREATININE 1.87* 1.71*  CALCIUM 9.2 8.9   Liver Function Tests: No results found for this basename: AST, ALT, ALKPHOS, BILITOT, PROT, ALBUMIN,  in the last 168 hours No results found for this basename: LIPASE, AMYLASE,  in the last 168 hours No results found for this basename: AMMONIA,  in the last 168 hours CBC:  Recent Labs Lab 02/14/14 1028 02/15/14 0435  WBC 7.2 6.2  NEUTROABS 4.8  --   HGB 14.2 13.6  HCT 42.7 41.5  MCV 90.7 90.6  PLT 283 262   Cardiac Enzymes: No results found for this basename: CKTOTAL, CKMB, CKMBINDEX, TROPONINI,  in the last 168 hours BNP: No components found with this basename: POCBNP,  CBG: No results  found for this basename: GLUCAP,  in the last 168 hours   Micro Results: No results found for this or any previous visit (from the past 240 hour(s)).  Studies/Results: Dg Chest 2 View  02/14/2014   CLINICAL DATA:  Stroke.  History of heart transplant.  EXAM: CHEST  2 VIEW  COMPARISON:  03/05/2009.  FINDINGS: Interval median sternotomy wires and mediastinal surgical clips. Borderline enlarged cardiac silhouette. Clear lungs with normal vascularity. Mild diffuse prominence of the interstitial markings without significant change. Thoracic spine degenerative changes. Diffuse osteopenia.  IMPRESSION: No acute abnormality. Stable mild chronic interstitial lung disease.   Electronically Signed   By: Enrique Sack M.D.   On: 02/14/2014 21:21   Ct Head Wo Contrast  02/14/2014   CLINICAL DATA:  Code stroke. Yesterday afternoon is experiencing left hand weakness and numbness.  EXAM: CT HEAD WITHOUT CONTRAST  TECHNIQUE: Contiguous axial images were obtained from the base of the skull through the vertex without intravenous contrast.  COMPARISON:  CT brain 08/18/2009  FINDINGS: There is no evidence of mass effect, midline shift, or extra-axial fluid collections. There is no evidence of a space-occupying lesion or intracranial hemorrhage. There is no evidence of a cortical-based area of acute infarction. There is a large old right basal ganglia infarct with encephalomalacia. There is an old left insular infarct. There is generalized cerebral atrophy. There is periventricular white matter low attenuation likely secondary to microangiopathy.  The ventricles and sulci are appropriate for the patient's age. The basal cisterns are patent.  Visualized portions of the orbits are unremarkable. The visualized portions of the paranasal sinuses and mastoid air cells are unremarkable. Cerebrovascular atherosclerotic calcifications are noted.  The osseous structures are unremarkable.  IMPRESSION: No acute intracranial pathology.   These results were called by telephone at the time of interpretation on 02/14/2014 at 10:50 am to Dr. Virgel Manifold , who verbally acknowledged these results.   Electronically Signed   By: Kathreen Devoid   On: 02/14/2014 10:52   Mr [name] Head Wo Contrast  02/14/2014   CLINICAL DATA:  Acute onset left facial numbness and left arm weakness. Coronary stent placed 02/04/2014  EXAM: MRI HEAD WITHOUT CONTRAST  MRA HEAD WITHOUT CONTRAST  TECHNIQUE: Multiplanar, multiecho pulse sequences of the brain and surrounding structures were obtained without intravenous contrast. Angiographic images of the head were obtained using MRA technique without contrast.  COMPARISON:  CT 02/14/2014.  MRI 08/18/2009  FINDINGS: MRI HEAD FINDINGS  Acute infarct right thalamus measures 8 x 10 mm. No other area of acute infarct.  Chronic hemorrhagic infarct right external capsule unchanged. Chronic infarct in the left insula. Chronic microvascular ischemia in the white matter. Brainstem and cerebellum intact.  Generalized atrophy.  Sub cm areas of susceptibility in the left frontal subarachnoid space may represent a small area of chronic hemorrhage. This was not present previously.  MRA HEAD FINDINGS  Both vertebral arteries patent to the basilar. PICA patent bilaterally. Basilar widely patent. Right superior cerebellar artery patent. Left superior cerebellar artery poorly visualized and may be stenotic. Posterior cerebral arteries patent bilaterally.  Internal carotid artery  patent bilaterally without stenosis. Anterior and middle cerebral artery patent bilaterally.  Negative for cerebral aneurysm.  IMPRESSION: Acute infarct right thalamus.  No other acute infarct  Atrophy and chronic ischemic change.  Possible stenosis left superior cerebellar artery. Otherwise negative MRA head.   Electronically Signed   By: Franchot Gallo M.D.   On: 02/14/2014 12:41   Mr Brain Wo Contrast  02/14/2014   CLINICAL DATA:  Acute onset left facial numbness and  left arm weakness. Coronary stent placed 02/04/2014  EXAM: MRI HEAD WITHOUT CONTRAST  MRA HEAD WITHOUT CONTRAST  TECHNIQUE: Multiplanar, multiecho pulse sequences of the brain and surrounding structures were obtained without intravenous contrast. Angiographic images of the head were obtained using MRA technique without contrast.  COMPARISON:  CT 02/14/2014.  MRI 08/18/2009  FINDINGS: MRI HEAD FINDINGS  Acute infarct right thalamus measures 8 x 10 mm. No other area of acute infarct.  Chronic hemorrhagic infarct right external capsule unchanged. Chronic infarct in the left insula. Chronic microvascular ischemia in the white matter. Brainstem and cerebellum intact.  Generalized atrophy.  Sub cm areas of susceptibility in the left frontal subarachnoid space may represent a small area of chronic hemorrhage. This was not present previously.  MRA HEAD FINDINGS  Both vertebral arteries patent to the basilar. PICA patent bilaterally. Basilar widely patent. Right superior cerebellar artery patent. Left superior cerebellar artery poorly visualized and may be stenotic. Posterior cerebral arteries patent bilaterally.  Internal carotid artery patent bilaterally without stenosis. Anterior and middle cerebral artery patent bilaterally.  Negative for cerebral aneurysm.  IMPRESSION: Acute infarct right thalamus.  No other acute infarct  Atrophy and chronic ischemic change.  Possible stenosis left superior cerebellar artery. Otherwise negative MRA head.   Electronically Signed   By: Franchot Gallo M.D.   On: 02/14/2014 12:41    Medications: Scheduled Meds: . aspirin EC  162 mg Oral QPM  . clopidogrel  75 mg Oral Daily  . cycloSPORINE modified  75 mg Oral BID  . enoxaparin (LOVENOX) injection  40 mg Subcutaneous Q24H  . mycophenolate  1,000 mg Oral BID  . rosuvastatin  5 mg Oral Daily  . senna-docusate  1 tablet Oral QHS      LOS: 1 day   Damin Salido M.D. [facility] 02/15/2014, 10:39 AM Pager:  [phone]  If 7PM-7AM, please contact night-coverage [email or web address] Password [email or web address]

## 2014-02-15 NOTE — Progress Notes (Signed)
STROKE TEAM PROGRESS NOTE   HISTORY Adam Waters is an 68 y.o. male who reports that he was at baseline until yesterday afternoon when while doing errands noted that his gait was unsteady. He also felt a little dizzy. Describes the dizziness as being off balance. As the day progressed his symptoms worsened and he noted numbness on the left side of his face, the left hand and the left foot. He did not wish to be seen at the hospital at that time. The patient awakened today with the same symptoms. When he accompanied his wife to the doctor today, he recommended that the patient be seen and he presented to the ED for further evaluation.   Patient had a similar episode on Labor Day weekend but this time his symptoms were associated with nausea and vomiting. Episode lasted for about 2 days. Patient had a prior stroke in 2010 that affected the left side. His symptoms cleared other than some clumsiness with the left hand.  Patient had a cardiac stent placement 2 weeks ago and has been on aspirin and Plavix since that time. Was on aspirin alone prior to that time.   Last known well 02/13/2014 at 14:30   Patient was not administered TPA secondary to delay in arrival. He was admitted for further evaluation and treatment.   SUBJECTIVE (INTERVAL HISTORY) No family is at the bedside.  Overall he feels his condition is improved with dizziness, numbness better. He still has mild left finger tip numbness and lack of dexterity in left hand.Adam Waters He is sitting up at the edge of the bed.    OBJECTIVE Temp:  [97.9 F (36.6 C)-98.6 F (37 C)] 97.9 F (36.6 C) (10/23 0929) Pulse Rate:  [72-99] 84 (10/23 0929) Cardiac Rhythm:  [-] Normal sinus rhythm (10/22 2000) Resp:  [14-20] 20 (10/23 0929) BP: (136-194)/(76-109) 164/95 mmHg (10/23 0929) SpO2:  [96 %-100 %] 100 % (10/23 0929)   Recent Labs Lab 02/14/14 1028 02/15/14 0435  NA 137 138  K 4.3 4.3  CL 100 102  CO2 23 22  GLUCOSE 94 95  BUN 26* 21   CREATININE 1.87* 1.71*  CALCIUM 9.2 8.9   No results found for this basename: AST, ALT, ALKPHOS, BILITOT, PROT, ALBUMIN,  in the last 168 hours  Recent Labs Lab 02/14/14 1028 02/15/14 0435  WBC 7.2 6.2  NEUTROABS 4.8  --   HGB 14.2 13.6  HCT 42.7 41.5  MCV 90.7 90.6  PLT 283 262   No results found for this basename: CKTOTAL, CKMB, CKMBINDEX, TROPONINI,  in the last 168 hours No results found for this basename: LABPROT, INR,  in the last 72 hours No results found for this basename: COLORURINE, APPERANCEUR, LABSPEC, [place], GLUCOSEU, HGBUR, BILIRUBINUR, KETONESUR, [place], UROBILINOGEN, NITRITE, LEUKOCYTESUR,  in the last 72 hours     Component Value Date/Time   CHOL 185 02/15/2014 0435   TRIG 167* 02/15/2014 0435   HDL 39* 02/15/2014 0435   CHOLHDL 4.7 02/15/2014 0435   VLDL 33 02/15/2014 0435   LDLCALC 113* 02/15/2014 0435   Lab Results  Component Value Date   HGBA1C  Value: 5.6 (NOTE) The ADA recommends the following therapeutic goal for glycemic control related to Hgb A1c measurement: Goal of therapy: <6.5 Hgb A1c  Reference: American Diabetes Association: Clinical Practice Recommendations 2010, Diabetes Care, 2010, 33: (Suppl  1). 03/05/2009      Component Value Date/Time   LABOPIA NONE DETECTED 08/18/2009 [place] DETECTED 08/18/2009 0137  LABBENZ POSITIVE* 08/18/2009 0137   AMPHETMU NONE DETECTED 08/18/2009 0137   THCU NONE DETECTED 08/18/2009 0137   LABBARB  Value: NONE DETECTED        DRUG SCREEN FOR MEDICAL PURPOSES ONLY.  IF CONFIRMATION IS NEEDED FOR ANY PURPOSE, NOTIFY LAB WITHIN 5 DAYS.        LOWEST DETECTABLE LIMITS FOR URINE DRUG SCREEN Drug Class       Cutoff (ng/mL) Amphetamine      1000 Barbiturate      200 Benzodiazepine   761 Tricyclics       950 Opiates          300 Cocaine          300 THC              50 08/18/2009 0137    No results found for this basename: ETH,  in the last 168 hours  Dg Chest 2 View  02/14/2014   CLINICAL DATA:   Stroke.  History of heart transplant.  EXAM: CHEST  2 VIEW  COMPARISON:  03/05/2009.  FINDINGS: Interval median sternotomy wires and mediastinal surgical clips. Borderline enlarged cardiac silhouette. Clear lungs with normal vascularity. Mild diffuse prominence of the interstitial markings without significant change. Thoracic spine degenerative changes. Diffuse osteopenia.  IMPRESSION: No acute abnormality. Stable mild chronic interstitial lung disease.   Electronically Signed   By: Enrique Sack M.D.   On: 02/14/2014 21:21   Ct Head Wo Contrast  02/14/2014   CLINICAL DATA:  Code stroke. Yesterday afternoon is experiencing left hand weakness and numbness.  EXAM: CT HEAD WITHOUT CONTRAST  TECHNIQUE: Contiguous axial images were obtained from the base of the skull through the vertex without intravenous contrast.  COMPARISON:  CT brain 08/18/2009  FINDINGS: There is no evidence of mass effect, midline shift, or extra-axial fluid collections. There is no evidence of a space-occupying lesion or intracranial hemorrhage. There is no evidence of a cortical-based area of acute infarction. There is a large old right basal ganglia infarct with encephalomalacia. There is an old left insular infarct. There is generalized cerebral atrophy. There is periventricular white matter low attenuation likely secondary to microangiopathy.  The ventricles and sulci are appropriate for the patient's age. The basal cisterns are patent.  Visualized portions of the orbits are unremarkable. The visualized portions of the paranasal sinuses and mastoid air cells are unremarkable. Cerebrovascular atherosclerotic calcifications are noted.  The osseous structures are unremarkable.  IMPRESSION: No acute intracranial pathology.  These results were called by telephone at the time of interpretation on 02/14/2014 at 10:50 am to Dr. Virgel Manifold , who verbally acknowledged these results.   Electronically Signed   By: Kathreen Devoid   On: 02/14/2014 10:52    Mr Adam Waters Head Wo Contrast  02/14/2014   CLINICAL DATA:  Acute onset left facial numbness and left arm weakness. Coronary stent placed 02/04/2014  EXAM: MRI HEAD WITHOUT CONTRAST  MRA HEAD WITHOUT CONTRAST  TECHNIQUE: Multiplanar, multiecho pulse sequences of the brain and surrounding structures were obtained without intravenous contrast. Angiographic images of the head were obtained using MRA technique without contrast.  COMPARISON:  CT 02/14/2014.  MRI 08/18/2009  FINDINGS: MRI HEAD FINDINGS  Acute infarct right thalamus measures 8 x 10 mm. No other area of acute infarct.  Chronic hemorrhagic infarct right external capsule unchanged. Chronic infarct in the left insula. Chronic microvascular ischemia in the white matter. Brainstem and cerebellum intact.  Generalized atrophy.  Sub cm areas of susceptibility  in the left frontal subarachnoid space may represent a small area of chronic hemorrhage. This was not present previously.  MRA HEAD FINDINGS  Both vertebral arteries patent to the basilar. PICA patent bilaterally. Basilar widely patent. Right superior cerebellar artery patent. Left superior cerebellar artery poorly visualized and may be stenotic. Posterior cerebral arteries patent bilaterally.  Internal carotid artery patent bilaterally without stenosis. Anterior and middle cerebral artery patent bilaterally.  Negative for cerebral aneurysm.  IMPRESSION: Acute infarct right thalamus.  No other acute infarct  Atrophy and chronic ischemic change.  Possible stenosis left superior cerebellar artery. Otherwise negative MRA head.   Electronically Signed   By: Franchot Gallo M.D.   On: 02/14/2014 12:41   Mr Brain Wo Contrast  02/14/2014   CLINICAL DATA:  Acute onset left facial numbness and left arm weakness. Coronary stent placed 02/04/2014  EXAM: MRI HEAD WITHOUT CONTRAST  MRA HEAD WITHOUT CONTRAST  TECHNIQUE: Multiplanar, multiecho pulse sequences of the brain and surrounding structures were obtained without  intravenous contrast. Angiographic images of the head were obtained using MRA technique without contrast.  COMPARISON:  CT 02/14/2014.  MRI 08/18/2009  FINDINGS: MRI HEAD FINDINGS  Acute infarct right thalamus measures 8 x 10 mm. No other area of acute infarct.  Chronic hemorrhagic infarct right external capsule unchanged. Chronic infarct in the left insula. Chronic microvascular ischemia in the white matter. Brainstem and cerebellum intact.  Generalized atrophy.  Sub cm areas of susceptibility in the left frontal subarachnoid space may represent a small area of chronic hemorrhage. This was not present previously.  MRA HEAD FINDINGS  Both vertebral arteries patent to the basilar. PICA patent bilaterally. Basilar widely patent. Right superior cerebellar artery patent. Left superior cerebellar artery poorly visualized and may be stenotic. Posterior cerebral arteries patent bilaterally.  Internal carotid artery patent bilaterally without stenosis. Anterior and middle cerebral artery patent bilaterally.  Negative for cerebral aneurysm.  IMPRESSION: Acute infarct right thalamus.  No other acute infarct  Atrophy and chronic ischemic change.  Possible stenosis left superior cerebellar artery. Otherwise negative MRA head.   Electronically Signed   By: Franchot Gallo M.D.   On: 02/14/2014 12:41     PHYSICAL EXAM Pleasant elderly caucasian male not in distress.Awake alert. Afebrile. Head is nontraumatic. Neck is supple without bruit. Hearing is normal. Cardiac exam no murmur or gallop. Lungs are clear to auscultation. Distal pulses are well felt. Neurological Exam : Awake alert oriented x 3 normal speech and language. Mild left lower face asymmetry. Tongue midline. Mild LUE drift. Mild diminished fine finger movements on left. Orbits right over left upper extremity. Mild left grip weak.. Normal sensation . Normal coordination. ASSESSMENT/PLAN Mr. Elizjah Noblet Hamre is a 68 y.o. 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 He did not receive IV t-PA  due to delay in arrival.   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 Given recent cardiac stent, should not alter current regimen.  Patient counseled to be compliant with his antithrombotic medications  Ongoing aggressive risk factor management encouraged  Resultant mild left finger tip numbness and decreased fine motor skills  Therapy recommendations:  pending   Disposition:  Anticipate d/c home  Hx Atrial Fibrillation pre-transplant  Home meds:  Aspirin and plavix, continued in the hospital  No anticoagulation secondary to transplanted heart without atrial fibrillation    Hypertension  Home meds:   Norvasc, hydralazine, not yet resumed in hospital  Ok to resume from stroke standpoint  Hyperlipidemia  Home meds:  crestor 5 mg daily resumed in hospital  LDL 113 goal < 70  Continue statin at discharge  Other Stroke Risk Factors Advanced age Former Cigarette smoker, quit 4 years ago Former chewing tobacco user   Hx stroke/TIA - 2 strokes within 24 h of cardioversion   Coronary artery disease - stent placement 01/2014 HF s/p heart transplant  Other Active Problems  CKD, stage 3  Hospital day # 1  SHARON BIBY, MSN, RN, ANVP-BC, ANP-BC,  [facility] Pager: [phone] 02/15/2014 10:52 AM   I have personally examined this patient, reviewed notes, independently viewed imaging studies, participated in medical decision making and plan of care. I have made any additions or clarifications directly to the above note. Agree with note above.  Antony Contras, MD Medical Director [facility] Pager: [phone] 02/15/2014 2:38 PM   To contact Stroke Continuity provider, please refer to [email or web address] After hours, contact General Neurology

## 2014-02-15 NOTE — Progress Notes (Signed)
UR complete.  Kynadee Dam RN, MSN 

## 2014-03-12 DIAGNOSIS — N183 Chronic kidney disease, stage 3 (moderate): Secondary | ICD-10-CM | POA: Diagnosis not present

## 2014-03-12 DIAGNOSIS — Z48298 Encounter for aftercare following other organ transplant: Secondary | ICD-10-CM | POA: Diagnosis not present

## 2014-03-12 DIAGNOSIS — Z8679 Personal history of other diseases of the circulatory system: Secondary | ICD-10-CM | POA: Diagnosis not present

## 2014-03-12 DIAGNOSIS — Z941 Heart transplant status: Secondary | ICD-10-CM | POA: Diagnosis not present

## 2014-03-12 DIAGNOSIS — Z79899 Other long term (current) drug therapy: Secondary | ICD-10-CM | POA: Diagnosis not present

## 2014-03-12 DIAGNOSIS — G4701 Insomnia due to medical condition: Secondary | ICD-10-CM | POA: Diagnosis not present

## 2014-04-05 DIAGNOSIS — Z48298 Encounter for aftercare following other organ transplant: Secondary | ICD-10-CM | POA: Diagnosis not present

## 2014-04-05 DIAGNOSIS — Z79899 Other long term (current) drug therapy: Secondary | ICD-10-CM | POA: Diagnosis not present

## 2014-04-05 DIAGNOSIS — Z941 Heart transplant status: Secondary | ICD-10-CM | POA: Diagnosis not present

## 2014-04-18 DIAGNOSIS — M79674 Pain in right toe(s): Secondary | ICD-10-CM | POA: Diagnosis not present

## 2014-04-18 DIAGNOSIS — L03031 Cellulitis of right toe: Secondary | ICD-10-CM | POA: Diagnosis not present

## 2014-05-13 ENCOUNTER — Encounter: Payer: Self-pay | Admitting: Neurology

## 2014-05-13 ENCOUNTER — Ambulatory Visit (INDEPENDENT_AMBULATORY_CARE_PROVIDER_SITE_OTHER): Payer: Medicare Other | Admitting: Neurology

## 2014-05-13 VITALS — BP 160/95 | HR 87 | Ht 70.0 in | Wt 193.8 lb

## 2014-05-13 DIAGNOSIS — G4733 Obstructive sleep apnea (adult) (pediatric): Secondary | ICD-10-CM

## 2014-05-13 DIAGNOSIS — G2581 Restless legs syndrome: Secondary | ICD-10-CM | POA: Diagnosis not present

## 2014-05-13 DIAGNOSIS — R269 Unspecified abnormalities of gait and mobility: Secondary | ICD-10-CM | POA: Diagnosis not present

## 2014-05-13 MED ORDER — GABAPENTIN 300 MG PO CAPS: 300.0000 mg | ORAL_CAPSULE | Freq: Every day | ORAL | Status: AC

## 2014-05-13 NOTE — Patient Instructions (Signed)
I had a long discussion with the patient and his wife regarding his recent right thalamic infarct and prior history of cerebrovascular disease as well as personally reviewed imaging studies and discuss results of evaluation. He remains at recurrent risk for strokes and TIAs. I strongly encourage him to use his CPAP for sleep apnea and referred to Dr. Roddie Mc or [place] to discuss alternative treatment options for CPAP as he appears to be intolerant to it. Trial of gabapentin 300 mg at night for restless legs. Maintain strict control of hypertension with blood pressure goal below 130/90 and lipids with LDL cholesterol goal below 70 mg percent. Continue aspirin and Plavix due to his cardiac stent. Return for follow-up in 3 months or call earlier if necessary. Stroke Prevention Some medical conditions and behaviors are associated with an increased chance of having a stroke. You may prevent a stroke by making healthy choices and managing medical conditions. HOW CAN I REDUCE MY RISK OF HAVING A STROKE?   Stay physically active. Get at least 30 minutes of activity on most or all days.  Do not smoke. It may also be helpful to avoid exposure to secondhand smoke.  Limit alcohol use. Moderate alcohol use is considered to be:  No more than 2 drinks per day for men.  No more than 1 drink per day for nonpregnant women.  Eat healthy foods. This involves:  Eating 5 or more servings of fruits and vegetables a day.  Making dietary changes that address high blood pressure (hypertension), high cholesterol, diabetes, or obesity.  Manage your cholesterol levels.  Making food choices that are high in fiber and low in saturated fat, trans fat, and cholesterol may control cholesterol levels.  Take any prescribed medicines to control cholesterol as directed by your health care provider.  Manage your diabetes.  Controlling your carbohydrate and sugar intake is recommended to manage diabetes.  Take any prescribed  medicines to control diabetes as directed by your health care provider.  Control your hypertension.  Making food choices that are low in salt (sodium), saturated fat, trans fat, and cholesterol is recommended to manage hypertension.  Take any prescribed medicines to control hypertension as directed by your health care provider.  Maintain a healthy weight.  Reducing calorie intake and making food choices that are low in sodium, saturated fat, trans fat, and cholesterol are recommended to manage weight.  Stop drug abuse.  Avoid taking birth control pills.  Talk to your health care provider about the risks of taking birth control pills if you are over 67 years old, smoke, get migraines, or have ever had a blood clot.  Get evaluated for sleep disorders (sleep apnea).  Talk to your health care provider about getting a sleep evaluation if you snore a lot or have excessive sleepiness.  Take medicines only as directed by your health care provider.  For some people, aspirin or blood thinners (anticoagulants) are helpful in reducing the risk of forming abnormal blood clots that can lead to stroke. If you have the irregular heart rhythm of atrial fibrillation, you should be on a blood thinner unless there is a good reason you cannot take them.  Understand all your medicine instructions.  Make sure that other conditions (such as anemia or atherosclerosis) are addressed. SEEK IMMEDIATE MEDICAL CARE IF:   You have sudden weakness or numbness of the face, arm, or leg, especially on one side of the body.  Your face or eyelid droops to one side.  You have sudden  confusion.  You have trouble speaking (aphasia) or understanding.  You have sudden trouble seeing in one or both eyes.  You have sudden trouble walking.  You have dizziness.  You have a loss of balance or coordination.  You have a sudden, severe headache with no known cause.  You have new chest pain or an irregular  heartbeat. Any of these symptoms may represent a serious problem that is an emergency. Do not wait to see if the symptoms will go away. Get medical help at once. Call your local emergency services (911 in [place] Do not drive yourself to the hospital. Document Released: 05/20/2004 Document Revised: 08/27/2013 Document Reviewed: 10/13/2012 [facility] Patient Information 2015 [place], [place]. This information is not intended to replace advice given to you by your health care provider. Make sure you discuss any questions you have with your health care provider.

## 2014-05-13 NOTE — Progress Notes (Signed)
[facility] [address] [place]. [place] [address] [phone]       OFFICE FOLLOW-UP NOTE  [name] Meals Date of Birth:  [birth year] Medical Record Number:  [id number]   HPI: 69 year male seen today for first office follow-up visit following hospital admission for stroke on 02/14/14. He was admitted with left-sided paresthesias and weakness and presented beyond time window for intervention. MRI scan of the brain showed a small right thalamic lacunar infarct. 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 Transthoracic echo showed normal ejection fraction. Carotid ultrasound showed no significant expectoration stenosis. Vascular risk factors identified included hypertension, hyperlipidemia, sleep apnea and coronary artery disease s/p stent in Oct 2015. Patient had history of heart transplant with cardiac stent in his transplanted heart and hence was continued on aspirin and Plavix. He states his left-sided paresthesias seem to have recovered but his balance is still poor. He has had some mild residual left-sided weakness and balance difficulties from his prior strokes which seem to have gotten slightly worse following the recent stroke. He gets fatigued quite easily and his balance is terrible. He did fall once last week. Patient admits that his sleep is quite disturbed and he has a lot of involuntary leg shaking in his sleep and his wife is unable to sleep because of that. He has been diagnosed with sleep apnea and prescribed CPAP but he does not use it consistently. He has never been tried on medications for restless legs.  ROS:   14 system review of systems is positive for fatigue, weight gain, hearing loss, rash, itching, snoring, easy bruising, joint pain, memory loss, confusion, numbness, weakness, slurred speech, dizziness, tremor, depression, not enough sleep, decreased energy, disinterest in activities, insomnia,  sleepiness, snoring, restless legs and all other systems negative PMH:  Past Medical History  Diagnosis Date  . Stroke     2 strokes w/in 24 hrs of cardioversion  . Heart failure   . Heart transplanted   . CAD (coronary artery disease), native artery transplanted heart   . Atrial fibrillation   . Chronic kidney disease, stage 3   . Hyperlipidemia   . Insomnia   . Skin cancer   . Cellulitis     ELBOW AND TOE  . Depression   . Anxiety     Social History:  History   Social History  . Marital Status: Married    Spouse Name: N/A    Number of Children: 2  . Years of Education: 12+   Occupational History  . Not on file.   Social History Main Topics  . Smoking status: Former Smoker -- 2.00 packs/day    Types: Cigarettes    Quit date: 02/14/2010  . Smokeless tobacco: Former Systems developer    Types: Chew  . Alcohol Use: No     Comment: QUIT IN 2011  . Drug Use: No  . Sexual Activity: Not on file   Other Topics Concern  . Not on file   Social History Narrative   Lives with wife, no assist device or JAARS services.  Primary care for heart transplant is at [facility].        Patient is right handed.   Patient drinks 1 glass of tea daily.    Medications:   Current Outpatient Prescriptions on File Prior to Visit  Medication Sig Dispense Refill  . amLODipine (NORVASC) 5 MG tablet Take 5 mg by mouth daily.    [name]  aspirin 81 MG tablet Take 162 mg by mouth every evening.    . Calcium Carbonate-Vitamin D (CALCIUM-VITAMIN D) 500-200 MG-UNIT per tablet Take 1 tablet by mouth daily.    . clonazePAM (KLONOPIN) 0.5 MG tablet Take 0.5 mg by mouth 3 times/day as needed-between meals & bedtime for anxiety (sleep).    . clopidogrel (PLAVIX) 75 MG tablet Take 75 mg by mouth daily.    . cycloSPORINE modified (GENGRAF) 25 MG capsule Take 75 mg by mouth 2 (two) times daily.     . furosemide (LASIX) 20 MG tablet Take 20 mg by mouth daily as needed for fluid or edema.    . hydrALAZINE (APRESOLINE) 50 MG tablet  Take 50 mg by mouth 2 (two) times daily.     . mycophenolate (CELLCEPT) 500 MG tablet Take 1,000 mg by mouth 2 (two) times daily.    . rosuvastatin (CRESTOR) 5 MG tablet Take 5 mg by mouth daily.     No current facility-administered medications on file prior to visit.    Allergies:  No Known Allergies  Physical Exam General: well developed, well nourished middle aged Caucasian male, seated, in no evident distress Head: head normocephalic and atraumatic.  Neck: supple with no carotid or supraclavicular bruits Cardiovascular: regular rate and rhythm, no murmurs Musculoskeletal: no deformity Skin:  no rash/petichiae Vascular:  Normal pulses all extremities Filed Vitals:   05/13/14 0923  BP: 160/95  Pulse: 87   Neurologic Exam Mental Status: Awake and fully alert. Oriented to place and time. Recent and remote memory intact. Attention span, concentration and fund of knowledge appropriate. Mood and affect appropriate.  Cranial Nerves: Fundoscopic exam reveals sharp disc margins. Pupils equal, briskly reactive to light. Extraocular movements full without nystagmus. Visual fields full to confrontation. Hearing intact. Facial sensation intact. Mild left lower face weakness., Tongue, palate moves normally and symmetrically.  Motor: Normal bulk and tone. Normal strength in all tested extremity muscles. Mild weakness of left grip. Diminished fine finger movements on the left. Mild weakness of intrinsic hand muscles on the left. Orbits right over left approximately. Sensory.: intact to touch ,pinprick .position and vibratory sensation.  Coordination: Rapid alternating movements normal in all extremities. Finger-to-nose and heel-to-shin performed accurately bilaterally. Gait and Station: Arises from chair without difficulty. Stance is normal. Gait demonstrates normal stride length and balance . Unable to heel, toe and tandem walk without difficulty.  Reflexes: 1+ and symmetric. Toes downgoing.    NIHSS  1 Modified Rankin  1   ASSESSMENT: 69 year male with right thalamic lacunar infarct in October 2015 secondary to small vessel disease with vascular risk factors of hypertension, hyperlipidemia, sleep apnea and coronary artery disease. New symptoms of involuntary leg movements in sleep likely restless leg syndrome    PLAN: I had a long discussion with the patient and his wife regarding his recent right thalamic infarct and prior history of cerebrovascular disease as well as personally reviewed imaging studies and discuss results of evaluation. He remains at recurrent risk for strokes and TIAs. I strongly encourage him to use his CPAP for sleep apnea and referred to Dr. Brett Fairy or Rexene Alberts to discuss alternative treatment options for CPAP as he appears to be intolerant to it. Trial of gabapentin 300 mg at night for restless legs. Maintain strict control of hypertension with blood pressure goal below 130/90 and lipids with LDL cholesterol goal below 70 mg percent. Continue aspirin and Plavix due to his cardiac stent. Return for follow-up in 3 months or  call earlier if necessary.    Note: This document was prepared with digital dictation and possible smart phrase technology. Any transcriptional errors that result from this process are unintentional

## 2014-05-14 ENCOUNTER — Telehealth: Payer: Self-pay | Admitting: Neurology

## 2014-05-14 NOTE — Telephone Encounter (Signed)
Antony Contras, MD, refers patient for attended sleep study.  Height: 5'10"  Weight: 193 lb 12.8 oz   BMI: 27.81  Past Medical History:  Stroke      2 strokes w/in 24 hrs of cardioversion  . Heart failure   . Heart transplanted   . CAD (coronary artery disease), native artery transplanted heart   . Atrial fibrillation   . Chronic kidney disease, stage 3   . Hyperlipidemia   . Insomnia   . Skin cancer   . Cellulitis     ELBOW AND TOE  . Depression   . Anxiety      Sleep Symptoms: insomnia, sleepiness, snoring, restless legs, fatigue, prescribed CPAP but he does not use it consistently.   Epworth Score: Unable to reach patient   Medication:  AmLODIPine Besylate (Tab) NORVASC 5 MG Take 5 mg by mouth daily.       Aspirin (Tab) aspirin 81 MG Take 162 mg by mouth every evening.      Calcium Carbonate-Vitamin D (Tab) calcium-vitamin D 500-200 MG-UNIT Take 1 tablet by mouth daily.      ClonazePAM (Tab) KLONOPIN 0.5 MG Take 0.5 mg by mouth 3 times/day as needed-between meals & bedtime for anxiety (sleep).      Clopidogrel Bisulfate (Tab) PLAVIX 75 MG Take 75 mg by mouth daily.      CycloSPORINE Modified (Cap) NEORAL 25 MG Take 75 mg by mouth 2 (two) times daily.       Furosemide (Tab) LASIX 20 MG Take 20 mg by mouth daily as needed for fluid or edema.      Gabapentin (Cap) NEURONTIN 300 MG Take 1 capsule (300 mg total) by mouth at bedtime.      HydrALAZINE HCl (Tab) APRESOLINE 50 MG Take 50 mg by mouth 2 (two) times daily.       Multiple Vitamin (Tab) MULTI-VITAMINS  Take by mouth.      Mycophenolate Mofetil (Tab) CELLCEPT 500 MG Take 1,000 mg by mouth 2 (two) times daily.      Rosuvastatin Calcium (Tab) CRESTOR 5 MG Take 5 mg by mouth daily.      ValACYclovir HCl (Tab) VALTREX 1000 MG Take 1,000 mg by mouth as needed.      .        Ins: Medicare/BCBS   Assessment & Plan: 69 year male with right  thalamic lacunar infarct in October 2015 secondary to small vessel disease with vascular risk factors of hypertension, hyperlipidemia, sleep apnea and coronary artery disease. New symptoms of involuntary leg movements in sleep likely restless leg syndrome    PLAN: I had a long discussion with the patient and his wife regarding his recent right thalamic infarct and prior history of cerebrovascular disease as well as personally reviewed imaging studies and discuss results of evaluation. He remains at recurrent risk for strokes and TIAs. I strongly encourage him to use his CPAP for sleep apnea and referred to Dr. Brett Fairy or Rexene Alberts to discuss alternative treatment options for CPAP as he appears to be intolerant to it. Trial of gabapentin 300 mg at night for restless legs. Maintain strict control of hypertension with blood pressure goal below 130/90 and lipids with LDL cholesterol goal below 70 mg percent. Continue aspirin and Plavix due to his cardiac stent. Return for follow-up in 3 months or call earlier if necessary.     Please review patient information and submit instructions for scheduling and orders for sleep technologist. Thank you.

## 2014-05-14 NOTE — Telephone Encounter (Signed)
Pls provide more information regarding the sleep apnea diagnosis. It says patient has been given a CPAP machine, but does not use it consistently. If this was recent, he is encouraged to go back to his original sleep doctor. When was his sleep study? Alternative treatments to CPAP therapy can be discussed with existing sleep doctor if feasible. Please provide more information as to the diagnosis of sleep apnea and when CPAP therapy was established. If it has been years since he saw a sleep doctor he may need another sleep study.

## 2014-05-15 NOTE — Telephone Encounter (Signed)
He prefers to be seen in our office to discuss alternative treatment options and new advances if any. In case you feel you may not be of any help we need not schedule him to see you

## 2014-05-16 DIAGNOSIS — I10 Essential (primary) hypertension: Secondary | ICD-10-CM | POA: Diagnosis not present

## 2014-05-16 DIAGNOSIS — L03031 Cellulitis of right toe: Secondary | ICD-10-CM | POA: Diagnosis not present

## 2014-05-20 NOTE — Telephone Encounter (Signed)
I spoke with patients wife Arbie Cookey she states pt had a heart transplant four years ago and the sleep study was performed before this procedure. Also, advised me that he is not compliant with his CPAP therapy since the heart transplant. Patient does not have a sleep physician that he follows up with. Please advise?

## 2014-05-20 NOTE — Telephone Encounter (Signed)
I would be happy to see him in clinic. He will need a new sleep study for further evaluation and given his heart disease, we would need to do an attended sleep study for proper evaluation. Pls arrange for new visit in the office and notify patient/wife.

## 2014-05-31 DIAGNOSIS — L570 Actinic keratosis: Secondary | ICD-10-CM | POA: Diagnosis not present

## 2014-05-31 DIAGNOSIS — D485 Neoplasm of uncertain behavior of skin: Secondary | ICD-10-CM | POA: Diagnosis not present

## 2014-05-31 DIAGNOSIS — Z85828 Personal history of other malignant neoplasm of skin: Secondary | ICD-10-CM | POA: Diagnosis not present

## 2014-06-03 DIAGNOSIS — L821 Other seborrheic keratosis: Secondary | ICD-10-CM | POA: Diagnosis not present

## 2014-06-03 DIAGNOSIS — C44529 Squamous cell carcinoma of skin of other part of trunk: Secondary | ICD-10-CM | POA: Diagnosis not present

## 2014-06-06 ENCOUNTER — Ambulatory Visit (INDEPENDENT_AMBULATORY_CARE_PROVIDER_SITE_OTHER): Payer: Medicare Other | Admitting: Neurology

## 2014-06-06 ENCOUNTER — Encounter: Payer: Self-pay | Admitting: Neurology

## 2014-06-06 VITALS — BP 136/90 | HR 90 | Temp 97.0°F | Resp 14 | Ht 70.0 in | Wt 195.0 lb

## 2014-06-06 DIAGNOSIS — Z8673 Personal history of transient ischemic attack (TIA), and cerebral infarction without residual deficits: Secondary | ICD-10-CM | POA: Diagnosis not present

## 2014-06-06 DIAGNOSIS — R351 Nocturia: Secondary | ICD-10-CM

## 2014-06-06 DIAGNOSIS — G2581 Restless legs syndrome: Secondary | ICD-10-CM

## 2014-06-06 DIAGNOSIS — G4733 Obstructive sleep apnea (adult) (pediatric): Secondary | ICD-10-CM

## 2014-06-06 DIAGNOSIS — Z941 Heart transplant status: Secondary | ICD-10-CM

## 2014-06-06 DIAGNOSIS — G478 Other sleep disorders: Secondary | ICD-10-CM | POA: Diagnosis not present

## 2014-06-06 DIAGNOSIS — I6930 Unspecified sequelae of cerebral infarction: Secondary | ICD-10-CM

## 2014-06-06 NOTE — Patient Instructions (Addendum)
You have a prior diagnosis of obstructive sleep apnea (OSA), and I think we should proceed with a sleep study to determine whether you still have evidence of OSA and how severe it is. If you have more than mild OSA, I want you to consider treatment with CPAP again. Please remember, the risks and ramifications of moderate to severe obstructive sleep apnea or OSA are: Cardiovascular disease, including congestive heart failure, stroke, difficult to control hypertension, arrhythmias, and even type 2 diabetes has been linked to untreated OSA. Sleep apnea causes disruption of sleep and sleep deprivation in most cases, which, in turn, can cause recurrent headaches, problems with memory, mood, concentration, focus, and vigilance. Most people with untreated sleep apnea report excessive daytime sleepiness, which can affect their ability to drive. Please do not drive if you feel sleepy.  I will see you back after your sleep study to go over the test results and where to go from there. We will call you after your sleep study and to set up an appointment at the time.   Drink more water! Reduce your sweet tea intake gradually.   Please remember to try to maintain good sleep hygiene, which means: Keep a regular sleep and wake schedule, try not to exercise or have a meal within 2 hours of your bedtime, try to keep your bedroom conducive for sleep, that is, cool and dark, without light distractors such as an illuminated alarm clock, and refrain from watching TV right before sleep or in the middle of the night and do not keep the TV or radio on during the night. Also, try not to use or play on electronic devices at bedtime, such as your cell phone, tablet PC or laptop. If you like to read at bedtime on an electronic device, try to dim the background light as much as possible. Do not eat in the middle of the night.

## 2014-06-06 NOTE — Progress Notes (Signed)
Subjective:    Patient ID: Adam Waters is a 69 y.o. male.  HPI     Star Age, MD, PhD [facility] [address], [address].O. [place], [place] [address]  Dear [name],   I saw your patient, Adam Waters, upon your kind request in my clinic today for initial consultation of his obstructive sleep apnea for reevaluation. The patient is accompanied by his wife today. As you know, [name] is a 69 year old right-handed gentleman with an underlying medical history of hypertension, hyperlipidemia, coronary artery disease, status post stent placement, history of heart transplant with stent placement in the transplanted heart, stroke in October 2015 with left-sided weakness and paresthesias secondary to right thalamic lacunar infarct, was diagnosed with obstructive sleep apnea years ago. He was given a CPAP machine but does not use it any longer. He essentially stopped using his CPAP after his heart transplant 4 years ago. He has not had any reevaluation of his obstructive sleep apnea.  He was compliant with CPAP therapy in the past and did not have any trouble tolerating the mask or pressure. He snores mildly to moderately per wife. She has not recently noted any apneic pauses. He denies morning headaches but has nocturia at least 2-3 times per night but up to 7 times on some nights. He has some restless leg symptoms. He was given a prescription for gabapentin for this which has helped. He has trouble going to sleep at times. He is twitching in his sleep per wife. His Epworth sleepiness score is 3 out of a maximum of 24. He has some residual leg weakness from his stroke and feels insecure when walking at times. He did not have any outpatient physical therapy. He tries to stay active. He is not drinking enough water. He likes to drink sweet tea, 2-3 glasses per day and does not drink any other sodas or coffee but again does not drink enough water. His bedtime varies. He goes to  bed around 11:30 on most nights. He has multiple nighttime awakenings, particularly because of bathroom breaks. His rise time is between 9:30 and 10 usually and he does not wake up rested. He has been driving without problems. He also drives a tractor. He does not have a family history of sleep apnea but has a family history of heart disease.  He likes to go to sleep with music on. This bothers his wife. She likes the TV on which he does not like.  His Past Medical History Is Significant For: Past Medical History  Diagnosis Date  . Heart failure   . Heart transplanted   . CAD (coronary artery disease), native artery transplanted heart   . Atrial fibrillation   . Chronic kidney disease, stage 3   . Hyperlipidemia   . Insomnia   . Skin cancer   . Cellulitis     ELBOW AND TOE  . Depression   . Anxiety   . Hypertension   . Stroke     2 strokes w/in 24 hrs of cardioversion    His Past Surgical History Is Significant For: Past Surgical History  Procedure Laterality Date  . Heart transplant  2012    [facility]  . Coronary angioplasty with stent placement  01/2014    in transplanted heart at [facility]  . Knee surgery Left 2008  . Eye surgery Right 2009    [facility]    His Family History Is Significant For: Family History  Problem Relation Age of  Onset  . Heart disease Mother   . Stroke Neg Hx   . Heart disease Father     His Social History Is Significant For: History   Social History  . Marital Status: Married    Spouse Name: N/A  . Number of Children: 2  . Years of Education: 12+   Social History Main Topics  . Smoking status: Former Smoker -- 2.00 packs/day    Types: Cigarettes    Quit date: 02/14/2010  . Smokeless tobacco: Former Systems developer    Types: Chew  . Alcohol Use: No     Comment: QUIT IN 2011  . Drug Use: No  . Sexual Activity: Not on file   Other Topics Concern  . None   Social History Narrative   Lives with wife, no assist device or [employer] services.  Primary care for  heart transplant [facility] at [facility].        Patient is right handed.   Patient drinks 1 glass of tea daily.    His Allergies Are:  No Known Allergies:   His Current Medications Are:  Outpatient Encounter Prescriptions as of 06/06/2014  Medication Sig  . amLODipine (NORVASC) 5 MG tablet Take 5 mg by mouth daily.  [name] aspirin 81 MG tablet Take 162 mg by mouth every evening.  . Calcium Carbonate-Vitamin D (CALCIUM-VITAMIN D) 500-200 MG-UNIT per tablet Take 1 tablet by mouth daily.  . clonazePAM (KLONOPIN) 0.5 MG tablet Take 0.5 mg by mouth 3 times/day as needed-between meals & bedtime for anxiety (sleep).  . clopidogrel (PLAVIX) 75 MG tablet Take 75 mg by mouth daily.  . cycloSPORINE modified (GENGRAF) 25 MG capsule Take 75 mg by mouth 2 (two) times daily.   . furosemide (LASIX) 20 MG tablet Take 20 mg by mouth daily as needed for fluid or edema.  . gabapentin (NEURONTIN) 300 MG capsule Take 1 capsule (300 mg total) by mouth at bedtime.  . hydrALAZINE (APRESOLINE) 50 MG tablet Take 50 mg by mouth 2 (two) times daily.   . Multiple Vitamin (MULTI-VITAMINS) TABS Take by mouth.  . mycophenolate (CELLCEPT) 500 MG tablet Take 1,000 mg by mouth 2 (two) times daily.  . rosuvastatin (CRESTOR) 5 MG tablet Take 5 mg by mouth daily.  . valACYclovir (VALTREX) 1000 MG tablet Take 1,000 mg by mouth as needed.  :  Review of Systems:  Out of a complete 14 point review of systems, all are reviewed and negative with the exception of these symptoms as listed below:   Review of Systems  Constitutional: Positive for fatigue.  HENT: Positive for hearing loss.   Endocrine: Positive for cold intolerance.  Musculoskeletal:       Joint pain, aching muscles  Skin:       itching  Allergic/Immunologic:       Skin sensitivity  Neurological: Positive for dizziness, tremors and weakness.       Snoring when on back per wife,insomnia, sleepiness, restless legs  Hematological: Bruises/bleeds easily.   Psychiatric/Behavioral:       Depression, anxiety, decreased energy, disinterest in activities    Objective:  Neurologic Exam  Physical Exam Physical Examination:   Filed Vitals:   06/06/14 0954  BP: 136/90  Pulse: 90  Temp:   Resp:     General Examination: The patient is a very pleasant 69 y.o. male in no acute distress. He appears well-developed and well-nourished and well groomed.   HEENT: Normocephalic, atraumatic, pupils are equal, round and reactive to light and accommodation. Funduscopic  exam is normal with sharp disc margins noted. Extraocular tracking is good without limitation to gaze excursion or nystagmus noted. He seems to have mild bilateral cataracts. Normal smooth pursuit is noted. Hearing is grossly intact. Face is symmetric with normal facial animation and normal facial sensation. Speech is clear with no dysarthria noted. There is no hypophonia. There is no lip, neck/head, jaw or voice tremor. Neck is supple with full range of passive and active motion. There are no carotid bruits on auscultation. Oropharynx exam reveals: moderate mouth dryness, adequate dental hygiene and moderate airway crowding, due tredundant soft palate and wider uvula. Tonsils are small or absent even. Mallampati is class II.  Chest: Clear to auscultation without wheezing, rhonchi or crackles noted.  Heart: S1+S2+0, regular and normal without murmurs, rubs or gallops noted.   Abdomen: Soft, non-tender and non-distended with normal bowel sounds appreciated on auscultation.  Extremities: There is no pitting edema in the distal lower extremities bilaterally. Pedal pulses are intact.  Skin: Warm and dry without trophic changes noted. There are no varicose veins.  Musculoskeletal: exam reveals no obvious joint deformities, tenderness or joint swelling or erythema.   Neurologically:  Mental status: The patient is awake, alert and oriented in all 4 spheres. His immediate and remote memory,  attention, language skills and fund of knowledge are appropriate. There is no evidence of aphasia, agnosia, apraxia or anomia. Speech is clear with normal prosody and enunciation. Thought process is linear. Mood is normal and affect is normal.  Cranial nerves II - XII are as described above under HEENT exam. In addition: shoulder shrug is normal with equal shoulder height noted. Motor exam: Normal bulk, strength and tone is noted, with the exception of very mild weakness in the left hip flexor and foot dorsiflexion. There is no drift, tremor or rebound. Romberg is negative. Reflexes are 2+ throughout, with the exception of 3+ in the left knee. Fine motor skills and coordination: intact with normal finger taps, normal hand movements, normal rapid alternating patting, normal foot taps and normal foot agility.  Cerebellar testing: No dysmetria or intention tremor on finger to nose testing. Heel to shin is unremarkable bilaterally. There is no truncal or gait ataxia.  Sensory exam: intact to light touch throughout.  Gait, station and balance: He stands easily. No veering to one side is noted. No leaning to one side is noted. Posture is age-appropriate and stance is narrow based. Gait is slightly slow and he has a very mild limp on the right side. He walks cautiously and turns in 3 steps. He has preserved arm swing bilaterally.   Assessment and Plan:  In summary, Ashleigh Arya Pair is a very pleasant 69 y.o.-year old male with an underlying medical history of hypertension, hyperlipidemia, coronary artery disease, status post stent placement, history of heart transplant with stent placement in the transplanted heart, stroke in October 2015 with left-sided weakness and paresthesias secondary to right thalamic lacunar infarct, was diagnosed with obstructive sleep apnea years ago. He was on CPAP therapy but stopped using his CPAP after his heart transplant. Given his medical history and his prior diagnosis of OSA I do  agree with you that it is imperative that he be reevaluated at least. He may need to consider starting CPAP again depending on what we find during his sleep study. Therefore, I would like to invite him back for an overnight polysomnogram, possibly a split-night sleep study if need be. He's reluctant to pursue this but agreeable to coming  back for sleep study and even open to the idea of trying CPAP again. Thankfully he did not have any problems tolerating CPAP in the past and he was compliant at the time according to the patient and his wife endorses this as well.  I had a long chat with the patient and his wife about my findings and the diagnosis of OSA, its prognosis and treatment options. We talked about medical treatments, surgical interventions and non-pharmacological approaches. I explained in particular the risks and ramifications of untreated moderate to severe OSA, especially with respect to developing cardiovascular disease down the Road, including congestive heart failure, difficult to treat hypertension, cardiac arrhythmias, or stroke. Even type 2 diabetes has, in part, been linked to untreated OSA. Symptoms of untreated OSA include daytime sleepiness, memory problems, mood irritability and mood disorder such as depression and anxiety, lack of energy, as well as recurrent headaches, especially morning headaches. We talked about trying to maintain a healthy lifestyle in general, as well as the importance of weight control. I encouraged the patient to eat healthy, exercise daily and keep well hydrated, to keep a scheduled bedtime and wake time routine, to not skip any meals and eat healthy snacks in between meals. I advised the patient not to drive when feeling sleepy.I asked him to increase his water intake and decrease his sweet tea intake.  I recommended the following at this time: sleep study with potential positive airway pressure titration. (We will score hypopneas at 4% and split the sleep study  into diagnostic and treatment portion, if the estimated. 2 hour AHI is >15/h).   I explained the sleep test procedure to the patient and also outlined possible surgical and non-surgical treatment options of OSA, including the use of a custom-made dental device (which would require a referral to a specialist dentist or oral surgeon), upper airway surgical options, such as pillar implants, radiofrequency surgery, tongue base surgery, and UPPP (which would involve a referral to an ENT surgeon). Rarely, jaw surgery such as mandibular advancement may be considered.  I also explained the CPAP treatment option to the patient, who indicated that he would be willing to try CPAP if the need arises. I explained the importance of being compliant with PAP treatment, not only for insurance purposes but primarily to improve His symptoms of nonrestorative sleep and nocturia, and for the patient's long term health benefit, including to reduce Her cardiovascular risks. I answered all their questions today and the patient and his wife were in agreement. I would like to see him back after the sleep study is completed and encouraged him to call with any interim questions, concerns, problems or updates.   Thank you very much for allowing me to participate in the care of this nice patient. If I can be of any further assistance to you please do not hesitate to talk to me.   Sincerely,   Star Age, MD, PhD

## 2014-06-17 DIAGNOSIS — L570 Actinic keratosis: Secondary | ICD-10-CM | POA: Diagnosis not present

## 2014-06-21 DIAGNOSIS — I2510 Atherosclerotic heart disease of native coronary artery without angina pectoris: Secondary | ICD-10-CM | POA: Diagnosis not present

## 2014-06-21 DIAGNOSIS — Z79899 Other long term (current) drug therapy: Secondary | ICD-10-CM | POA: Diagnosis not present

## 2014-06-22 ENCOUNTER — Emergency Department (HOSPITAL_COMMUNITY): Payer: Medicare Other

## 2014-06-22 ENCOUNTER — Encounter (HOSPITAL_COMMUNITY): Payer: Self-pay | Admitting: Emergency Medicine

## 2014-06-22 ENCOUNTER — Emergency Department (HOSPITAL_COMMUNITY)
Admission: EM | Admit: 2014-06-22 | Discharge: 2014-06-22 | Disposition: A | Discharge status: Home / Self Care | Payer: Medicare Other | Attending: Emergency Medicine | Admitting: Emergency Medicine

## 2014-06-22 DIAGNOSIS — Z9861 Coronary angioplasty status: Secondary | ICD-10-CM | POA: Insufficient documentation

## 2014-06-22 DIAGNOSIS — R270 Ataxia, unspecified: Secondary | ICD-10-CM

## 2014-06-22 DIAGNOSIS — Z872 Personal history of diseases of the skin and subcutaneous tissue: Secondary | ICD-10-CM | POA: Insufficient documentation

## 2014-06-22 DIAGNOSIS — Z87891 Personal history of nicotine dependence: Secondary | ICD-10-CM | POA: Diagnosis not present

## 2014-06-22 DIAGNOSIS — R260 Ataxic gait: Secondary | ICD-10-CM | POA: Insufficient documentation

## 2014-06-22 DIAGNOSIS — I4891 Unspecified atrial fibrillation: Secondary | ICD-10-CM | POA: Insufficient documentation

## 2014-06-22 DIAGNOSIS — R531 Weakness: Secondary | ICD-10-CM | POA: Diagnosis not present

## 2014-06-22 DIAGNOSIS — I129 Hypertensive chronic kidney disease with stage 1 through stage 4 chronic kidney disease, or unspecified chronic kidney disease: Secondary | ICD-10-CM | POA: Insufficient documentation

## 2014-06-22 DIAGNOSIS — N183 Chronic kidney disease, stage 3 (moderate): Secondary | ICD-10-CM | POA: Insufficient documentation

## 2014-06-22 DIAGNOSIS — Z79899 Other long term (current) drug therapy: Secondary | ICD-10-CM | POA: Insufficient documentation

## 2014-06-22 DIAGNOSIS — R42 Dizziness and giddiness: Secondary | ICD-10-CM | POA: Insufficient documentation

## 2014-06-22 DIAGNOSIS — E785 Hyperlipidemia, unspecified: Secondary | ICD-10-CM | POA: Diagnosis not present

## 2014-06-22 DIAGNOSIS — Z941 Heart transplant status: Secondary | ICD-10-CM | POA: Insufficient documentation

## 2014-06-22 DIAGNOSIS — G4700 Insomnia, unspecified: Secondary | ICD-10-CM | POA: Insufficient documentation

## 2014-06-22 DIAGNOSIS — Z7982 Long term (current) use of aspirin: Secondary | ICD-10-CM | POA: Insufficient documentation

## 2014-06-22 DIAGNOSIS — R404 Transient alteration of awareness: Secondary | ICD-10-CM | POA: Diagnosis not present

## 2014-06-22 DIAGNOSIS — Z8673 Personal history of transient ischemic attack (TIA), and cerebral infarction without residual deficits: Secondary | ICD-10-CM | POA: Insufficient documentation

## 2014-06-22 DIAGNOSIS — Z7901 Long term (current) use of anticoagulants: Secondary | ICD-10-CM | POA: Diagnosis not present

## 2014-06-22 DIAGNOSIS — I25811 Atherosclerosis of native coronary artery of transplanted heart without angina pectoris: Secondary | ICD-10-CM | POA: Insufficient documentation

## 2014-06-22 DIAGNOSIS — Z85828 Personal history of other malignant neoplasm of skin: Secondary | ICD-10-CM | POA: Insufficient documentation

## 2014-06-22 DIAGNOSIS — R918 Other nonspecific abnormal finding of lung field: Secondary | ICD-10-CM | POA: Diagnosis not present

## 2014-06-22 LAB — BASIC METABOLIC PANEL
Anion gap: 6 (ref 5–15)
BUN: 23 mg/dL (ref 6–23)
CO2: 24 mmol/L (ref 19–32)
Calcium: 8.7 mg/dL (ref 8.4–10.5)
Chloride: 109 mmol/L (ref 96–112)
Creatinine, Ser: 1.96 mg/dL — ABNORMAL HIGH (ref 0.50–1.35)
GFR calc non Af Amer: 33 mL/min — ABNORMAL LOW (ref >90)
GFR, EST AFRICAN AMERICAN: 38 mL/min — AB (ref >90)
GLUCOSE: 99 mg/dL (ref 70–99)
Potassium: 4.4 mmol/L (ref 3.5–5.1)
SODIUM: 139 mmol/L (ref 135–145)

## 2014-06-22 LAB — CBC WITH DIFFERENTIAL/PLATELET
BASOS ABS: 0 10*3/uL (ref 0.0–0.1)
BASOS PCT: 0 % (ref 0–1)
EOS PCT: 0 % (ref 0–5)
Eosinophils Absolute: 0 10*3/uL (ref 0.0–0.7)
HCT: 42.6 % (ref 39.0–52.0)
Hemoglobin: 13.9 g/dL (ref 13.0–17.0)
LYMPHS ABS: 1 10*3/uL (ref 0.7–4.0)
Lymphocytes Relative: 11 % — ABNORMAL LOW (ref 12–46)
MCH: 30.2 pg (ref 26.0–34.0)
MCHC: 32.6 g/dL (ref 30.0–36.0)
MCV: 92.6 fL (ref 78.0–100.0)
Monocytes Absolute: 0.9 10*3/uL (ref 0.1–1.0)
Monocytes Relative: 10 % (ref 3–12)
Neutro Abs: 7.1 10*3/uL (ref 1.7–7.7)
Neutrophils Relative %: 79 % — ABNORMAL HIGH (ref 43–77)
Platelets: 220 10*3/uL (ref 150–400)
RBC: 4.6 MIL/uL (ref 4.22–5.81)
RDW: 13.6 % (ref 11.5–15.5)
WBC: 9 10*3/uL (ref 4.0–10.5)

## 2014-06-22 LAB — TROPONIN I: Troponin I: <0.03 ng/mL (ref <0.031)

## 2014-06-22 LAB — CBG MONITORING, ED: Glucose-Capillary: 97 mg/dL (ref 70–99)

## 2014-06-22 MED ORDER — MECLIZINE HCL 25 MG PO TABS: 25.0000 mg | ORAL_TABLET | Freq: Three times a day (TID) | ORAL | Status: AC | PRN

## 2014-06-22 NOTE — Discharge Instructions (Signed)
CT scan of your head showed no acute findings. Follow-up with your primary care doctor.

## 2014-06-22 NOTE — ED Notes (Signed)
MD at bedside. 

## 2014-06-22 NOTE — ED Notes (Signed)
Pt reports decreased fine motor function and generalized weakness x 1 day. Pt has his of CVA x 2. Pt also has cough denies any fever

## 2014-06-22 NOTE — ED Provider Notes (Signed)
CSN: [id number]     Arrival date & time 06/22/14  1841 History  This chart was scribed for Nat Christen, MD by Martinique Peace, ED Scribe. The patient was seen in [facility]06/[facility]06. The patient's care was started at 6:52 PM.    Chief Complaint  Patient presents with  . Weakness      Patient is a 69 y.o. male presenting with weakness. The history is provided by the patient and the spouse. No language interpreter was used.  Weakness This is a recurrent problem. The current episode started yesterday. The problem occurs constantly. The problem has been gradually worsening. Nothing relieves the symptoms. He has tried nothing for the symptoms.   HPI Comments: Adam Waters is a 69 y.o. male who presents to the Emergency Department complaining of dizziness onset last night that has continued until today. 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 Pt states that when he went to the mailbox earlier today at the end of his driveway, he was not able to walk in a straight path. He states he was stumbling "all over the place". He then adds that after getting back in the house, he had trouble trying to get into the bed and was not able to "pull himself up" which caused him to come to ED today to be evaluated. His wife explains that current episode is worse than his normal behavior. She admits that pt does not show any difficulties with speech, cognitive processes, or being able to move his extremities but his dizziness and imbalance are very severe. Pt later reports history of heart transplant. No complaints of fever.    Past Medical History  Diagnosis Date  . Heart failure   . Heart transplanted   . CAD (coronary artery disease), native artery transplanted heart   . Atrial fibrillation   . Chronic kidney disease, stage 3   . Hyperlipidemia   . Insomnia   . Skin cancer   . Cellulitis     ELBOW AND TOE  . Depression   . Anxiety   .  Hypertension   . Stroke     2 strokes w/in 24 hrs of cardioversion   Past Surgical History  Procedure Laterality Date  . Heart transplant  2012    [facility]  . Coronary angioplasty with stent placement  01/2014    in transplanted heart at [facility]  . Knee surgery Left 2008  . Eye surgery Right 2009    [facility]   Family History  Problem Relation Age of Onset  . Heart disease Mother   . Stroke Neg Hx   . Heart disease Father    History  Substance Use Topics  . Smoking status: Former Smoker -- 2.00 packs/day    Types: Cigarettes    Quit date: 02/14/2010  . Smokeless tobacco: Former Systems developer    Types: Chew  . Alcohol Use: No     Comment: QUIT IN 2011    Review of Systems  Constitutional: Negative for fever.  Neurological: Positive for dizziness and weakness. Negative for speech difficulty.      Allergies  Review of patient's allergies indicates no known allergies.  Home Medications   Prior to Admission medications   Medication Sig Start Date End Date Taking? Authorizing Provider  amLODipine (NORVASC) 5 MG tablet Take 5 mg by mouth daily.   Yes Historical Provider, MD  aspirin 81 MG tablet Take 162 mg  by mouth every evening.   Yes Historical Provider, MD  Calcium Carbonate-Vitamin D (CALCIUM-VITAMIN D) 500-200 MG-UNIT per tablet Take 1 tablet by mouth daily.   Yes Historical Provider, MD  clonazePAM (KLONOPIN) 0.5 MG tablet Take 0.5 mg by mouth at bedtime as needed for anxiety (sleep).    Yes Historical Provider, MD  clopidogrel (PLAVIX) 75 MG tablet Take 75 mg by mouth daily.   Yes Historical Provider, MD  cycloSPORINE modified (GENGRAF) 25 MG capsule Take 75 mg by mouth 2 (two) times daily.    Yes Historical Provider, MD  gabapentin (NEURONTIN) 300 MG capsule Take 1 capsule (300 mg total) by mouth at bedtime. 05/13/14  Yes Antony Contras, MD  hydrALAZINE (APRESOLINE) 50 MG tablet Take 50 mg by mouth 2 (two) times daily.    Yes Historical Provider, MD  ibuprofen (ADVIL,MOTRIN) 200 MG  tablet Take 600-800 mg by mouth every 6 (six) hours as needed for mild pain.   Yes Historical Provider, MD  Multiple Vitamin (MULTI-VITAMINS) TABS Take by mouth. 07/07/10  Yes Historical Provider, MD  mycophenolate (CELLCEPT) 500 MG tablet Take 1,000 mg by mouth 2 (two) times daily.   Yes Historical Provider, MD  rosuvastatin (CRESTOR) 5 MG tablet Take 5 mg by mouth daily.   Yes Historical Provider, MD  valACYclovir (VALTREX) 1000 MG tablet Take 1,000 mg by mouth as needed (Cold Sores).  07/30/13   Historical Provider, MD   BP 170/100 mmHg  Pulse 98  Temp(Src) 99.7 F (37.6 C) (Oral)  Resp 18  Wt 195 lb (88.451 kg)  SpO2 96% Physical Exam  Constitutional: He is oriented to person, place, and time. He appears well-developed and well-nourished.  HENT:  Head: Normocephalic and atraumatic.  Eyes: Conjunctivae and EOM are normal. Pupils are equal, round, and reactive to light.  Neck: Normal range of motion. Neck supple.  Cardiovascular: Normal rate and regular rhythm.   Pulmonary/Chest: Effort normal and breath sounds normal.  Abdominal: Soft. Bowel sounds are normal.  Musculoskeletal: Normal range of motion.  Neurological: He is alert and oriented to person, place, and time.  Skin: Skin is warm and dry.  Psychiatric: He has a normal mood and affect. His behavior is normal.  Nursing note and vitals reviewed.   ED Course  Procedures (including critical care time) Labs Review Labs Reviewed  BASIC METABOLIC PANEL - Abnormal; Notable for the following:    Creatinine, Ser 1.96 (*)    GFR calc non Af Amer 33 (*)    GFR calc Af Amer 38 (*)    All other components within normal limits  CBC WITH DIFFERENTIAL/PLATELET - Abnormal; Notable for the following:    Neutrophils Relative % 79 (*)    Lymphocytes Relative 11 (*)    All other components within normal limits  TROPONIN I  CBG MONITORING, ED    Imaging Review Dg Chest 2 View  06/22/2014   CLINICAL DATA:  PATIENT STATES " HAVING  WEAKNESS, TIRED, CAN'T WALK, STAGGERING, SYMPTOMS BECAME MORE WORSE THAN NORMAL LAST NIGHT" HISTORY OF STROKE, 3 STROKES,  EXAM: CHEST  2 VIEW  COMPARISON:  None.  FINDINGS: Sternotomy wires overlie normal cardiac silhouette. No effusion, infiltrate, or pneumothorax. Lungs are hyperinflated.  IMPRESSION: Hyperinflated lungs.  No acute findings.   Electronically Signed   By: Suzy Bouchard M.D.   On: 06/22/2014 20:28   Ct Head Wo Contrast  06/22/2014   CLINICAL DATA:  Dizziness, onset last night been continuing till today. History of 2 CVA incidence  with residual difficulty in control of the left side of the body. Heart transplant 4 years ago. Hypertension. Ataxia.  EXAM: CT HEAD WITHOUT CONTRAST  TECHNIQUE: Contiguous axial images were obtained from the base of the skull through the vertex without intravenous contrast.  COMPARISON:  MRI brain 02/14/2014.  CT head 02/14/2014.  FINDINGS: Diffuse cerebral atrophy. Ventricular dilatation consistent with central atrophy. Low-attenuation changes in the deep white matter consistent small vessel ischemia. Old encephalomalacia in the right internal capsule region. Old lacunar infarct in the right thalamus. Focal encephalomalacia in the left external capsule region. No significant change since previous study. No mass effect or midline shift. No abnormal extra-axial fluid collections. Gray-white matter junctions are distinct. Basal cisterns are not effaced. No evidence of acute intracranial hemorrhage. No depressed skull fractures. Partial opacification of the ethmoid air cells. Air-fluid level in the right maxillary antrum. Findings are nonspecific but likely inflammatory. Mastoid air cells are not opacified. Vascular calcifications.  IMPRESSION: No acute intracranial abnormalities. Chronic atrophy and small vessel ischemic changes. Old areas of encephalomalacia similar to previous study.   Electronically Signed   By: Lucienne Capers M.D.   On: 06/22/2014 20:11      EKG Interpretation   Date/Time:  Saturday June 22 2014 18:49:58 EST Ventricular Rate:  95 PR Interval:  179 QRS Duration: 91 QT Interval:  343 QTC Calculation: 431 R Axis:   51 Text Interpretation:  Sinus rhythm Baseline wander in lead(s) V2 Confirmed  by Tyrisha Benninger  MD, Jathen Sudano ([id number]) on 06/22/2014 7:38:32 PM     Medications - No data to display  7:00 PM- Treatment plan was discussed with patient who verbalizes understanding and agrees.   MDM   Final diagnoses:  Ataxia    Both patient and his wife report baseline behavior and movement. He appears in no acute distress. CT of head shows no acute changes. Patient does not want to be admitted to the hospital. Rx for Antivert 25 mg. Creatinine slightly elevated. This is not a new finding.   I personally performed the services described in this documentation, which was scribed in my presence. The recorded information has been reviewed and is accurate.    Nat Christen, MD 06/22/14 2156

## 2014-06-22 NOTE — ED Notes (Signed)
Lab at bedside

## 2014-06-22 NOTE — ED Notes (Signed)
Pt given a drink per Dr. Jonathon [place] verbal order.

## 2014-06-27 DIAGNOSIS — C44529 Squamous cell carcinoma of skin of other part of trunk: Secondary | ICD-10-CM | POA: Diagnosis not present

## 2014-06-27 DIAGNOSIS — I639 Cerebral infarction, unspecified: Secondary | ICD-10-CM | POA: Diagnosis not present

## 2014-06-27 DIAGNOSIS — I10 Essential (primary) hypertension: Secondary | ICD-10-CM | POA: Diagnosis not present

## 2014-06-28 DIAGNOSIS — L905 Scar conditions and fibrosis of skin: Secondary | ICD-10-CM | POA: Diagnosis not present

## 2014-06-30 ENCOUNTER — Ambulatory Visit (INDEPENDENT_AMBULATORY_CARE_PROVIDER_SITE_OTHER): Payer: Medicare Other | Admitting: Neurology

## 2014-06-30 DIAGNOSIS — G4761 Periodic limb movement disorder: Secondary | ICD-10-CM

## 2014-06-30 DIAGNOSIS — G4733 Obstructive sleep apnea (adult) (pediatric): Secondary | ICD-10-CM

## 2014-06-30 DIAGNOSIS — G2581 Restless legs syndrome: Secondary | ICD-10-CM

## 2014-06-30 DIAGNOSIS — I6930 Unspecified sequelae of cerebral infarction: Secondary | ICD-10-CM

## 2014-06-30 DIAGNOSIS — R351 Nocturia: Secondary | ICD-10-CM

## 2014-06-30 DIAGNOSIS — G478 Other sleep disorders: Secondary | ICD-10-CM

## 2014-06-30 DIAGNOSIS — G479 Sleep disorder, unspecified: Secondary | ICD-10-CM

## 2014-06-30 DIAGNOSIS — Z941 Heart transplant status: Secondary | ICD-10-CM

## 2014-07-01 NOTE — Sleep Study (Signed)
Please see the scanned sleep study interpretation located in the Procedure tab within the Chart Review section. 

## 2014-07-02 ENCOUNTER — Telehealth: Payer: Self-pay | Admitting: Neurology

## 2014-07-02 NOTE — Telephone Encounter (Signed)
Patient's spouse, Arbie Cookey @ [phone] stated patient saw PCP, Dr. Asencion Noble last week due to falls.  Dr. Willey Blade requesting Dr. Leonie Man order an MRI to r/o Stroke, due to stroke wasn't seen on CT Scan.  Please call and advise.

## 2014-07-05 ENCOUNTER — Other Ambulatory Visit (HOSPITAL_COMMUNITY): Payer: Self-pay | Admitting: Internal Medicine

## 2014-07-05 ENCOUNTER — Telehealth: Payer: Self-pay | Admitting: Neurology

## 2014-07-05 ENCOUNTER — Other Ambulatory Visit: Payer: Self-pay | Admitting: Internal Medicine

## 2014-07-05 DIAGNOSIS — R269 Unspecified abnormalities of gait and mobility: Secondary | ICD-10-CM

## 2014-07-05 NOTE — Telephone Encounter (Signed)
Dr Leonie Man, please see note below

## 2014-07-05 NOTE — Telephone Encounter (Signed)
I called the patient's listed home number and left a message for his wife Arbie Cookey to call me to discuss the patient's condition to see whether I need to see him sooner or order an MRI.

## 2014-07-05 NOTE — Telephone Encounter (Signed)
Spoke with patient and she states that patient has been off balance, having difficulty walking but has started back acting normal, patient has been scheduled for Tuesday 07/09/14 9 am. Patient's PCP Dr Willey Blade has ordered MRI of the brain and was scheduled at [facility] for 07/11/14, patient's wife wanted to have MRI at [facility], but was informed that it is only done on Wednesday & Thursday. Wife states that she is going to call [facility], and [facility] to see if that will get the patient in sooner. Informed patient that I will call her back to see if she was able to get the MRI scheduled. If she was unsuccessful with scheduling MRI then we will try to get patient in here for the MRI.

## 2014-07-05 NOTE — Telephone Encounter (Signed)
Patient's wife called and said that Dr Leonie Man wants you to call her about Kreg @[phone].  Please see prior 2 messages.  Thanks!

## 2014-07-05 NOTE — Telephone Encounter (Signed)
Called spouse back and transferred call to [place] so she can discuss her MRI.

## 2014-07-08 NOTE — Telephone Encounter (Signed)
Patient spoke with Adam Waters and was explained that MRI could not be done here due to patient's insurance, patient decided to go ahead with appointment at [facility] on Sunday 07/14/14.

## 2014-07-09 ENCOUNTER — Encounter: Payer: Self-pay | Admitting: Neurology

## 2014-07-09 ENCOUNTER — Ambulatory Visit (INDEPENDENT_AMBULATORY_CARE_PROVIDER_SITE_OTHER): Payer: Medicare Other | Admitting: Neurology

## 2014-07-09 VITALS — BP 149/91 | HR 91 | Ht 70.0 in | Wt 194.8 lb

## 2014-07-09 DIAGNOSIS — R2681 Unsteadiness on feet: Secondary | ICD-10-CM | POA: Insufficient documentation

## 2014-07-09 DIAGNOSIS — I6320 Cerebral infarction due to unspecified occlusion or stenosis of unspecified precerebral arteries: Secondary | ICD-10-CM | POA: Diagnosis not present

## 2014-07-09 NOTE — Patient Instructions (Signed)
I had a long discussion with the patient and his wife regarding his recent gait imbalance and falls and recommend keeping upcoming appointment for MRI to image any interval new stroke. Also check carotid ultrasound, transcranial Doppler studies and refer to outpatient physical and occupational therapy to improve safety and balance. Continue aspirin and Plavix for stroke prevention with strict control of hypertension with blood pressure goal below 130/90 and lipids with LDL cholesterol goal below 70 mg percent and return for follow-up in 3 months or call earlier if necessary. Patient was also advised fall prevention and home safety precautions Fall Prevention and Home Safety Falls cause injuries and can affect all age groups. It is possible to use preventive measures to significantly decrease the likelihood of falls. There are many simple measures which can make your home safer and prevent falls. OUTDOORS  Repair cracks and edges of walkways and driveways.  Remove high doorway thresholds.  Trim shrubbery on the main path into your home.  Have good outside lighting.  Clear walkways of tools, rocks, debris, and clutter.  Check that handrails are not broken and are securely fastened. Both sides of steps should have handrails.  Have leaves, snow, and ice cleared regularly.  Use sand or salt on walkways during winter months.  In the garage, clean up grease or oil spills. BATHROOM  Install night lights.  Install grab bars by the toilet and in the tub and shower.  Use non-skid mats or decals in the tub or shower.  Place a plastic non-slip stool in the shower to sit on, if needed.  Keep floors dry and clean up all water on the floor immediately.  Remove soap buildup in the tub or shower on a regular basis.  Secure bath mats with non-slip, double-sided rug tape.  Remove throw rugs and tripping hazards from the floors. BEDROOMS  Install night lights.  Make sure a bedside light is easy  to reach.  Do not use oversized bedding.  Keep a telephone by your bedside.  Have a firm chair with side arms to use for getting dressed.  Remove throw rugs and tripping hazards from the floor. KITCHEN  Keep handles on pots and pans turned toward the center of the stove. Use back burners when possible.  Clean up spills quickly and allow time for drying.  Avoid walking on wet floors.  Avoid hot utensils and knives.  Position shelves so they are not too high or low.  Place commonly used objects within easy reach.  If necessary, use a sturdy step stool with a grab bar when reaching.  Keep electrical cables out of the way.  Do not use floor polish or wax that makes floors slippery. If you must use wax, use non-skid floor wax.  Remove throw rugs and tripping hazards from the floor. STAIRWAYS  Never leave objects on stairs.  Place handrails on both sides of stairways and use them. Fix any loose handrails. Make sure handrails on both sides of the stairways are as long as the stairs.  Check carpeting to make sure it is firmly attached along stairs. Make repairs to worn or loose carpet promptly.  Avoid placing throw rugs at the top or bottom of stairways, or properly secure the rug with carpet tape to prevent slippage. Get rid of throw rugs, if possible.  Have an electrician put in a light switch at the top and bottom of the stairs. OTHER FALL PREVENTION TIPS  Wear low-heel or rubber-soled shoes that are supportive and  fit well. Wear closed toe shoes.  When using a stepladder, make sure it is fully opened and both spreaders are firmly locked. Do not climb a closed stepladder.  Add color or contrast paint or tape to grab bars and handrails in your home. Place contrasting color strips on first and last steps.  Learn and use mobility aids as needed. Install an electrical emergency response system.  Turn on lights to avoid dark areas. Replace light bulbs that burn out  immediately. Get light switches that glow.  Arrange furniture to create clear pathways. Keep furniture in the same place.  Firmly attach carpet with non-skid or double-sided tape.  Eliminate uneven floor surfaces.  Select a carpet pattern that does not visually hide the edge of steps.  Be aware of all pets. OTHER HOME SAFETY TIPS  Set the water temperature for 120 F (48.8 C).  Keep emergency numbers on or near the telephone.  Keep smoke detectors on every level of the home and near sleeping areas. Document Released: 04/02/2002 Document Revised: 10/12/2011 Document Reviewed: 07/02/2011 [facility] Patient Information 2015 [place], [place]. This information is not intended to replace advice given to you by your health care provider. Make sure you discuss any questions you have with your health care provider.

## 2014-07-09 NOTE — Progress Notes (Signed)
[facility] [address] [place]. [place] [address] [phone]       OFFICE FOLLOW-UP NOTE  [name] Date of Birth:  05-01-1945 Medical Record Number:  [id number]   HPI: 69 year male seen today for first office follow-up visit following hospital admission for stroke on 02/14/14. He was admitted with left-sided paresthesias and weakness and presented beyond time window for intervention. MRI scan of the brain showed a small right thalamic lacunar infarct. 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 Transthoracic echo showed normal ejection fraction. Carotid ultrasound showed no significant expectoration stenosis. Vascular risk factors identified included hypertension, hyperlipidemia, sleep apnea and coronary artery disease s/p stent in Oct 2015. Patient had history of heart transplant with cardiac stent in his transplanted heart and hence was continued on aspirin and Plavix. He states his left-sided paresthesias seem to have recovered but his balance is still poor. He has had some mild residual left-sided weakness and balance difficulties from his prior strokes which seem to have gotten slightly worse following the recent stroke. He gets fatigued quite easily and his balance is terrible. He did fall once last week. Patient admits that his sleep is quite disturbed and he has a lot of involuntary leg shaking in his sleep and his wife is unable to sleep because of that. He has been diagnosed with sleep apnea and prescribed CPAP but he does not use it consistently. He has never been tried on medications for restless legs. Update 07/09/2014 : Patient is worked into the schedule urgently today as wife called saying patient has been having increasing gait and balance difficulties since 2 weeks ago. He had trouble walking down his driveway and fell on the way to the mailbox. He needed help to get up and then had trouble  getting into the bed. He has been noticing increased dragging of his left leg and his foot catching and imbalance. He was seen in the emergency room on 06/22/[facility] at [facility]. CT scan of the head did not reveal acute abnormality. He was sent home from there but when he saw Dr. Willey Waters last week he was not back to his baseline and hence he ordered an MRI scan of the brain which is scheduled for Sunday 07/14/14. Patient also had some lab work in his office which was apparently all right. He recently had a sleep study done last week in our office the results of which are yet elevated. He remains on aspirin and Plavix which is tolerating well with only minor bruising and no significant bleeding. He states his blood pressure has been under good control. Patient does have mild residual left-sided weakness from his prior stroke in October 2015 for which she did not fully recover. His hemoglobin A1c at that time was 5.2 and total cholesterol 185 and LDL 113 mg percent. He is on Crestor However he was able to walk a little better with outpatient therapies at that time. He denies any headache, double vision, slurred speech. ROS:   14 system review of systems is positive for fatigue,    hearing loss,  speech difficulty, weakness, gait imbalance, falls, tremors, neck pain, restless legs, daytime sleepiness, snoring and depression and all other systems negative PMH:  Past Medical History  Diagnosis Date  . Heart failure   . Heart transplanted   . CAD (coronary artery disease), native artery transplanted heart   . Atrial fibrillation   . Chronic  kidney disease, stage 3   . Hyperlipidemia   . Insomnia   . Skin cancer   . Cellulitis     ELBOW AND TOE  . Depression   . Anxiety   . Hypertension   . Stroke     2 strokes w/in 24 hrs of cardioversion    Social History:  History   Social History  . Marital Status: Married    Spouse Name: N/A  . Number of Children: 2  . Years of Education: 12+    Occupational History  . Not on file.   Social History Main Topics  . Smoking status: Former Smoker -- 2.00 packs/day    Types: Cigarettes    Quit date: 02/14/2010  . Smokeless tobacco: Former Systems developer    Types: Chew  . Alcohol Use: No     Comment: QUIT IN 2011  . Drug Use: No  . Sexual Activity: Not on file   Other Topics Concern  . Not on file   Social History Narrative   Lives with wife, no assist device or Martha services.  Primary care for heart transplant is at [facility]        Patient is right handed.   Patient drinks 1 glass of tea daily.    Medications:   Current Outpatient Prescriptions on File Prior to Visit  Medication Sig Dispense Refill  . amLODipine (NORVASC) 5 MG tablet Take 5 mg by mouth daily.    [name] aspirin 81 MG tablet Take 162 mg by mouth every evening.    . Calcium Carbonate-Vitamin D (CALCIUM-VITAMIN D) 500-200 MG-UNIT per tablet Take 1 tablet by mouth daily.    . clonazePAM (KLONOPIN) 0.5 MG tablet Take 0.5 mg by mouth at bedtime as needed for anxiety (sleep).     . clopidogrel (PLAVIX) 75 MG tablet Take 75 mg by mouth daily.    . cycloSPORINE modified (GENGRAF) 25 MG capsule Take 75 mg by mouth 2 (two) times daily.     [name] gabapentin (NEURONTIN) 300 MG capsule Take 1 capsule (300 mg total) by mouth at bedtime. 90 capsule 11  . hydrALAZINE (APRESOLINE) 50 MG tablet Take 50 mg by mouth 2 (two) times daily.     [name] ibuprofen (ADVIL,MOTRIN) 200 MG tablet Take 600-800 mg by mouth every 6 (six) hours as needed for mild pain.    . meclizine (ANTIVERT) 25 MG tablet Take 1 tablet (25 mg total) by mouth 3 (three) times daily as needed for dizziness. 20 tablet 0  . Multiple Vitamin (MULTI-VITAMINS) TABS Take by mouth.    . mycophenolate (CELLCEPT) 500 MG tablet Take 1,000 mg by mouth 2 (two) times daily.    . rosuvastatin (CRESTOR) 5 MG tablet Take 5 mg by mouth daily.    . valACYclovir (VALTREX) 1000 MG tablet Take 1,000 mg by mouth as needed (Cold Sores).      No current  facility-administered medications on file prior to visit.    Allergies:  No Known Allergies  Physical Exam General: well developed, well nourished middle aged Caucasian male, seated, in no evident distress Head: head normocephalic and atraumatic.  Neck: supple with no carotid or supraclavicular bruits Cardiovascular: regular rate and rhythm, no murmurs Musculoskeletal: no deformity Skin:  no rash/petichiae Vascular:  Normal pulses all extremities Filed Vitals:   07/09/14 0901  BP: 149/91  Pulse: 91   Neurologic Exam Mental Status: Awake and fully alert. Oriented to place and time. Recent and remote memory intact. Attention span, concentration and fund of  knowledge appropriate. Mood and affect appropriate.  Cranial Nerves: Fundoscopic exam not done Pupils equal, briskly reactive to light. Extraocular movements full without nystagmus. Visual fields full to confrontation. Hearing intact. Facial sensation intact. Mild left lower face weakness., Tongue, palate moves normally and symmetrically.  Motor: Normal bulk and tone. Normal strength in all tested extremity muscles. Mild weakness of left grip. Diminished fine finger movements on the left. Mild weakness of intrinsic hand muscles on the left. Orbits right over left. Mild weakness of left hip flexors and ankle dorsiflexors approximately. Increased tone in the left lower extremity. Sensory.: intact to touch ,pinprick .position and vibratory sensation.  Coordination: Rapid alternating movements normal in all extremities. Finger-to-nose and heel-to-shin performed accurately bilaterally. Gait and Station: Arises from chair without difficulty. Stance is normal. Gait demonstrates dragging of the left leg with slight imbalance when he turns. . Unable to heel, toe and tandem walk without difficulty.  Reflexes: 1+ and symmetric. Toes downgoing.   NIHSS  1 Modified Rankin  2   ASSESSMENT: 69 year male with right thalamic lacunar infarct in October  2015 secondary to small vessel disease with mild residual left hemiparesis and vascular risk factors of hypertension, hyperlipidemia, sleep apnea and coronary artery disease. New gait imbalance and falls difficulties possibly new stroke versus deconditioning  PLAN:  I had a long discussion with the patient and his wife regarding his recent gait imbalance and falls and recommend keeping upcoming appointment for MRI to image any interval new stroke. Also check carotid ultrasound, transcranial Doppler studies and refer to outpatient physical and occupational therapy to improve safety and balance. Continue aspirin and Plavix for stroke prevention given history of coronary artery disease with strict control of hypertension with blood pressure goal below 130/90 and lipids with LDL cholesterol goal below 70 mg percent and follow up on sleep study results and if sleep apnea is confirmed may need to start using CPAP again. Return for follow-up in 3 months or call earlier if necessary. Patient was also advised fall prevention and home safety precautions    Note: This document was prepared with digital dictation and possible smart phrase technology. Any transcriptional errors that result from this process are unintentional

## 2014-07-10 ENCOUNTER — Ambulatory Visit: Payer: Medicare Other | Admitting: Occupational Therapy

## 2014-07-10 ENCOUNTER — Ambulatory Visit: Payer: Medicare Other | Attending: Neurology | Admitting: Physical Therapy

## 2014-07-10 DIAGNOSIS — R6889 Other general symptoms and signs: Secondary | ICD-10-CM | POA: Diagnosis not present

## 2014-07-10 DIAGNOSIS — R29898 Other symptoms and signs involving the musculoskeletal system: Secondary | ICD-10-CM | POA: Insufficient documentation

## 2014-07-10 DIAGNOSIS — R269 Unspecified abnormalities of gait and mobility: Secondary | ICD-10-CM | POA: Diagnosis not present

## 2014-07-10 DIAGNOSIS — R279 Unspecified lack of coordination: Secondary | ICD-10-CM | POA: Diagnosis not present

## 2014-07-10 DIAGNOSIS — IMO0002 Reserved for concepts with insufficient information to code with codable children: Secondary | ICD-10-CM

## 2014-07-10 DIAGNOSIS — I6980 Unspecified sequelae of other cerebrovascular disease: Secondary | ICD-10-CM | POA: Insufficient documentation

## 2014-07-10 NOTE — Therapy (Signed)
[facility] [address] [place], [place], [address] Phone: [phone]   Fax:  [phone]  Physical Therapy Evaluation  Patient Details  Name: Adam Waters MRN: [id number] Date of Birth: 30-Aug-1945 Referring Provider:  Garvin Fila, MD  Encounter Date: 07/10/2014      PT End of Session - 07/10/14 1023    Visit Number 1   Number of Visits 9  Eval + 8 visits requested in [place]   Date for PT Re-Evaluation 09/09/14   PT Start Time 0932   PT Stop Time 1016   PT Time Calculation (min) 44 min   Activity Tolerance Patient tolerated treatment well      Past Medical History  Diagnosis Date  . Heart failure   . Heart transplanted   . CAD (coronary artery disease), native artery transplanted heart   . Atrial fibrillation   . Chronic kidney disease, stage 3   . Hyperlipidemia   . Insomnia   . Skin cancer   . Cellulitis     ELBOW AND TOE  . Depression   . Anxiety   . Hypertension   . Stroke     2 strokes w/in 24 hrs of cardioversion    Past Surgical History  Procedure Laterality Date  . Heart transplant  2012    [facility]  . Coronary angioplasty with stent placement  01/2014    in transplanted heart [facility]  . Knee surgery Left 2008  . Eye surgery Right 2009    [facility]    There were no vitals filed for this visit.  Visit Diagnosis:  Abnormality of gait - Plan: PT plan of care cert/re-cert  Weakness of left lower extremity - Plan: PT plan of care cert/re-cert      Subjective Assessment - 07/10/14 0936    Symptoms Pt is a 69 year old male who presents to OP PT with history of multiple CVAs (per patient report), with increased weakness since September of 2015.  He has reported difficulty with walking, decreased balance, L sided weakness.  He reports dragging L leg with walking.  He has had several falls over the past 6 months, but he reports multiple stumbles per day.  He does not use assistive device.   Pertinent History 3 ruptured vertebrae in neck; history of heart transplant   Patient Stated Goals Pt's goal for therapy is to improve stumbling and help with walking.   Currently in Pain? No/denies            [facility] PT Assessment - 07/10/14 0943    Assessment   Medical Diagnosis CVA with L sided weakness   Onset Date --  Since September 2015   Precautions   Precautions Fall   Balance Screen   Has the patient fallen in the past 6 months Yes   How many times? 1  at least one   Has the patient had a decrease in activity level because of a fear of falling?  No  describes being more cautious   Is the patient reluctant to leave their home because of a fear of falling?  No   Home Environment   Living Enviornment Private residence   Living Arrangements Spouse/significant other  Wife has lung cancer   Available Help at Discharge Family   Type of [place] to enter   Entrance Stairs-Number of Steps 3   Entrance Stairs-Rails None   Home Layout One level;Laundry or work area  in basement;Able to live on main level with bedroom/bathroom  handrails on both sides   Prior Function   Level of Independence Independent with homemaking with ambulation;Independent with basic ADLs;Independent with gait;Independent with transfers  cooks and cleans/takes care of home   Leisure Enjoys yardwork, working on the tractor   ROM / Strength   AROM / PROM / Strength Strength   Strength   Strength Assessment Site Hip;Knee;Ankle   Right/Left Hip Right;Left   Right Hip Flexion 5/5   Left Hip Flexion 4+/5   Right/Left Knee Right;Left   Right Knee Flexion 5/5   Right Knee Extension 5/5   Left Knee Flexion 4/5   Left Knee Extension 4/5   Right/Left Ankle Right;Left   Right Ankle Dorsiflexion 5/5   Left Ankle Dorsiflexion 4+/5   Transfers   Transfers Sit to Stand;Stand to Sit   Sit to Stand 5: Supervision;Without upper extremity assist;From bed;Five times sit to stand  5x  sit<>stand:  18.56 sec with posterior lean   Stand to Sit 5: Supervision;Without upper extremity assist;To chair/3-in-1   Ambulation/Gait   Ambulation/Gait Yes   Ambulation/Gait Assistance 5: Supervision   Ambulation/Gait Assistance Details Pt appears to have Trendelenburg gait pattern on LLE, decreased arm swing with gait   Ambulation Distance (Feet) 150 Feet   Assistive device None   Gait Pattern Step-through pattern;Decreased step length - left;Decreased stride length;Decreased hip/knee flexion - left;Decreased dorsiflexion - left;Trendelenburg;Poor foot clearance - left  causing incr. forward flexed posture   Gait velocity 11.99 sec=2.73 ft/sec   Standardized Balance Assessment   Standardized Balance Assessment Timed Up and Go Test;Dynamic Gait Index   Dynamic Gait Index   Level Surface Mild Impairment  7.86 sec   Change in Gait Speed Mild Impairment  R foot catches on floor   Gait with Horizontal Head Turns Mild Impairment   Gait with Vertical Head Turns Mild Impairment   Gait and Pivot Turn Moderate Impairment   Step Over Obstacle Moderate Impairment   Step Around Obstacles Mild Impairment   Steps Mild Impairment   Total Score 14   Timed Up and Go Test   TUG Normal TUG;Cognitive TUG   Normal TUG (seconds) 17.7   Cognitive TUG (seconds) 20.7                                PT Long Term Goals - 07/10/14 1028    PT LONG TERM GOAL #1   Title Pt will be independent with HEP for improved strength, balance, gait. (Target 08/10/14)   Time 4   Period Weeks   Status New   PT LONG TERM GOAL #2   Title Pt will improve Timed Up and Go score to less than or equal to 13.5 seconds for decreased fall risk.   Time 4   Period Weeks   PT LONG TERM GOAL #3   Title Pt will improve Dynamic Gait Index score to at least 19/24 for decreased fall risk.   Time 4   Period Weeks   Status New   PT LONG TERM GOAL #4   Title Pt will verbalize understanding of fall  prevention within the home environment.   Time 4   Period Weeks   Status New   PT LONG TERM GOAL #5   Title Pt will demonstrate understanding of use of cane with gait, as evidenced by gait >500 ft on indoor/outdoor surfaces with cane, modified independently with  no LOB.   Time 4   Period Weeks   Status New               Plan - August 06, 2014 1025    Clinical Impression Statement Pt is a 69 year old male who presents to OP PT status post CVA, resulting in L sided weakness.  He is noted to have decreased foot clearance, decreased timing and coordination of L swing phase of gait, iwth history of falls.  He is at fall risk per Timed UP and Go and Dynamic Gait Index scores.  He would benefit from skilled PT to address gait, balance and lower extremity strengthening.     Pt will benefit from skilled therapeutic intervention in order to improve on the following deficits Abnormal gait;Decreased coordination;Decreased balance;Decreased mobility;Decreased knowledge of use of DME;Decreased strength;Difficulty walking   Rehab Potential Good   PT Frequency 2x / week   PT Duration 4 weeks  plus eval   PT Treatment/Interventions ADLs/Self Care Home Management;Functional mobility training;Gait training;DME Instruction;Therapeutic activities;Therapeutic exercise;Balance training;Neuromuscular re-education;Patient/family education   PT Next Visit Plan Check hip abductor and extensor strength on LLE; gait training with SPC; work on hip strengthening, balance/timing and coordination of LLE swing phase/foot clearance   Consulted and Agree with Plan of Care Patient          G-Codes - 08/06/14 1032    Functional Assessment Tool Used TUG 17.7, Cognitive TUG 20.7 sec; Dynamic Gait Index score 14/24; at least several falls in past 6 months   Functional Limitation Mobility: Walking and moving around   Mobility: Walking and Moving Around Current Status [phone]) At least 20 percent but less than 40 percent  impaired, limited or restricted   Mobility: Walking and Moving Around Goal Status [phone]) At least 1 percent but less than 20 percent impaired, limited or restricted       Problem List Patient Active Problem List   Diagnosis Date Noted  . Unsteady gait 07/09/2014  . Abnormality of gait 05/13/2014  . Obstructive sleep apnea 05/13/2014  . Restless legs 05/13/2014  . Acute ischemic stroke 02/14/2014  . Stroke   . Heart transplanted   . CAD (coronary artery disease), native artery transplanted heart   . Chronic kidney disease, stage 3   . Hyperlipidemia     Soren Lazarz W. 08/06/14, 10:36 AM  Mady Haagensen, PT 08-06-2014 10:37 AM Phone: [phone] Fax: [facility] [address] [place], [place], [address] Phone: [phone]   Fax:  [phone]

## 2014-07-10 NOTE — Therapy (Signed)
[facility] [address] [place], [place], [address] Phone: [phone]   Fax:  [phone]  Occupational Therapy Evaluation  Patient Details  Name: Adam Waters MRN: [id number] Date of Birth: November 14, 1945 Referring Provider:  Asencion Noble, MD  Encounter Date: 07/10/2014      OT End of Session - 07/10/14 1053    Visit Number 1  G1   Number of Visits 9   Date for OT Re-Evaluation 08/09/14   Authorization Type MCR   Authorization Time Period 60 DAYS   OT Start Time 1015   OT Stop Time 1055   OT Time Calculation (min) 40 min   Activity Tolerance Patient tolerated treatment well      Past Medical History  Diagnosis Date  . Heart failure   . Heart transplanted   . CAD (coronary artery disease), native artery transplanted heart   . Atrial fibrillation   . Chronic kidney disease, stage 3   . Hyperlipidemia   . Insomnia   . Skin cancer   . Cellulitis     ELBOW AND TOE  . Depression   . Anxiety   . Hypertension   . Stroke     2 strokes w/in 24 hrs of cardioversion    Past Surgical History  Procedure Laterality Date  . Heart transplant  2012    [facility]  . Coronary angioplasty with stent placement  01/2014    in transplanted heart at [facility]  . Knee surgery Left 2008  . Eye surgery Right 2009    [facility]    There were no vitals filed for this visit.  Visit Diagnosis:  Lack of coordination due to stroke - Plan: Ot plan of care cert/re-cert  Weakness of left upper extremity - Plan: Ot plan of care cert/re-cert  Decreased functional activity tolerance - Plan: Ot plan of care cert/re-cert      Subjective Assessment - 07/10/14 1022    Symptoms Lt hemiparesis   Pertinent History multiple CVA's, Rt rotator cuff surgery (30 years ago), heart transplant 2012   Patient Stated Goals work on coordination and balance, I also want a job   Currently in Pain? No/denies           [facility] OT Assessment - 07/10/14 1026     Assessment   Diagnosis Rt CVA   Onset Date 02/14/14  h/o multiple Rt CVA's with some residual Lt hemiparesis   Assessment Lt hemiparesis   Precautions   Precautions [place] Shower/Tub Tub/Shower unit;Curtain   Lives With Spouse  in 1 story home (with basement, rails both sides), 3 STE   Prior Function   Level of Independence Independent with basic ADLs;Independent with homemaking with ambulation  Independent w/ yardwork   Vocation Retired   ADL   ADL comments Independent w/ all ADL's, Pt also does all yardwork including mowing, fencing 15 acres per pt report   IADL   Light Housekeeping Maintains house alone or with occasional assistance   Meal Prep Plans, prepares and serves adequate meals independently   Written Expression   Dominant Hand Right   Vision - History   Baseline Vision Wears glasses only for reading   Additional Comments Pt denies changes in vision from CVA   Cognition   Overall Cognitive Status --  Verbose during evaluation, but friendly   Behaviors Other (comment)  ? impulsive vs. safety/judgement concerning own capabilities   Sensation   Additional Comments denies  change in LUE   Coordination   9 Hole Peg Test Right;Left   Right 9 Hole Peg Test 26.22 sec   Left 9 Hole Peg Test 40.53 sec   Box and Blocks Lt = 35   ROM / Strength   AROM / PROM / Strength AROM;Strength   AROM   Overall AROM Comments BUE AROM WNL's except unable to oppose to 5th digit with thumb bilaterally. Pt reports old injuries bilateral hands due to work tasks; and fractures Rt wrist when younger. 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 or less   Baseline Lt = 40.53 sec.    Time 4   Period Weeks   Status New   OT LONG TERM GOAL #3   Title Pt to perform 15 min. of intense physical activity for simulated yard work w/o LOB demonstrating good safety awareness   Time 4   Period Weeks   Status New               Plan - 08/08/2014 1115    Clinical Impression Statement Pt is a 69 y.o. male who presents today with Rt CVA, Lt hemiparesis and h/o multiple Rt CVA's. Pt still very active per pt report.    Pt will benefit from skilled therapeutic intervention in order to improve on the following deficits (Retired) Decreased knowledge of use of DME;Decreased coordination;Decreased mobility;Decreased activity tolerance;Decreased strength;Decreased balance;Impaired UE functional use;Decreased safety awareness   Rehab Potential Good   Clinical Impairments Affecting Rehab Potential ? impulsivity vs. good safety/judgement as to his capabilities   OT Frequency 2x / week   OT Duration 4 weeks  plus evaluation   OT Treatment/Interventions Self-care/ADL training;Cognitive remediation/compensation;Neuromuscular education;Moist Heat;Functional Furniture conservator/restorer;Therapeutic exercises;Patient/family education;DME and/or AE instruction;Manual Therapy;Therapeutic activities;Passive range of motion   Plan coordination HEP, LUE strengthening HEP   Consulted and Agree with Plan of Care Patient          G-Codes - 08-08-14 1056    Functional Assessment Tool Used 9 hole peg test Lt = 40.53 sec. (Rt = 26.22 sec)   Functional Limitation Carrying, moving and handling objects   Carrying, Moving and Handling Objects Current Status [phone]) At least 20 percent but less than 40 percent impaired, limited or restricted   Carrying, Moving and Handling Objects Goal Status  ([id number]) At least 1 percent but less than 20 percent impaired, limited or restricted      Problem List Patient Active Problem List   Diagnosis Date Noted  . Unsteady gait 07/09/2014  . Abnormality of gait 05/13/2014  . Obstructive sleep apnea 05/13/2014  . Restless legs 05/13/2014  . Acute ischemic stroke 02/14/2014  . Stroke   . Heart transplanted   . CAD (coronary artery  disease), native artery transplanted heart   . Chronic kidney disease, stage 3   . Hyperlipidemia     Carey Bullocks, OTR/L 07/10/2014, 1:10 PM  [facility] [address] [place], [place], [address] Phone: [phone]   Fax:  [phone]

## 2014-07-11 ENCOUNTER — Other Ambulatory Visit (HOSPITAL_COMMUNITY): Payer: Medicare Other

## 2014-07-12 ENCOUNTER — Telehealth: Payer: Self-pay | Admitting: Neurology

## 2014-07-12 NOTE — Telephone Encounter (Signed)
Please call and notify the patient that the recent sleep study showed overall mild obstructive sleep apnea and that I recommend that he try to sleep off his back and try to lose some more weight. Treatment for his OSA with CPAP can be considered as well. If he wishes to discuss CPAP treatment, we can make a FU appt. For mild OSA, treatment with CPAP is not a set recommendation and is usually pursued if the patient has significant Sx, such as EDS. If we pursue CPAP, it will require a repeat sleep study for proper titration and mask fitting. Please explain to patient and arrange for FU appt if he wishes. Otherwise, I will see him back as needed and he can continue to FU with Dr. Leonie Man. Thanks, Star Age, MD, PhD [facility])

## 2014-07-14 ENCOUNTER — Ambulatory Visit
Admission: RE | Admit: 2014-07-14 | Discharge: 2014-07-14 | Disposition: A | Discharge status: Home / Self Care | Payer: Medicare Other | Source: Ambulatory Visit | Attending: Internal Medicine | Admitting: Internal Medicine

## 2014-07-14 DIAGNOSIS — R51 Headache: Secondary | ICD-10-CM | POA: Diagnosis not present

## 2014-07-14 DIAGNOSIS — R2689 Other abnormalities of gait and mobility: Secondary | ICD-10-CM | POA: Diagnosis not present

## 2014-07-14 DIAGNOSIS — R251 Tremor, unspecified: Secondary | ICD-10-CM | POA: Diagnosis not present

## 2014-07-14 DIAGNOSIS — R269 Unspecified abnormalities of gait and mobility: Secondary | ICD-10-CM

## 2014-07-15 ENCOUNTER — Encounter: Payer: Self-pay | Admitting: Neurology

## 2014-07-15 ENCOUNTER — Ambulatory Visit: Payer: Medicare Other | Admitting: Occupational Therapy

## 2014-07-15 ENCOUNTER — Ambulatory Visit: Payer: Medicare Other

## 2014-07-15 VITALS — BP 150/89 | HR 89

## 2014-07-15 DIAGNOSIS — R269 Unspecified abnormalities of gait and mobility: Secondary | ICD-10-CM

## 2014-07-15 DIAGNOSIS — IMO0002 Reserved for concepts with insufficient information to code with codable children: Secondary | ICD-10-CM

## 2014-07-15 DIAGNOSIS — R29898 Other symptoms and signs involving the musculoskeletal system: Secondary | ICD-10-CM | POA: Diagnosis not present

## 2014-07-15 DIAGNOSIS — I6980 Unspecified sequelae of other cerebrovascular disease: Secondary | ICD-10-CM | POA: Diagnosis not present

## 2014-07-15 DIAGNOSIS — R279 Unspecified lack of coordination: Secondary | ICD-10-CM | POA: Diagnosis not present

## 2014-07-15 DIAGNOSIS — R6889 Other general symptoms and signs: Secondary | ICD-10-CM | POA: Diagnosis not present

## 2014-07-15 NOTE — Patient Instructions (Signed)
HIP: Flexion / KNEE: Extension, Straight Leg Raise (Weight)   Lying on back with one knee bent and on e straight.  Raise leg up, keeping knee straight. Perform slowly. Hold _1-2__ seconds.  _15__ reps per set, _1__ sets per day, _6__ days per week  Repeat on opposite leg.   Copyright  VHI. All rights reserved.  Bridging   Slowly raise buttocks from floor, keeping stomach tight.  Hold 5 seconds. Repeat __15__ times per set. Do __1__ sets per session. Do _1___ sessions per day.  http://orth.exer.us/1096   Copyright  VHI. All rights reserved.  Abduction: Clam (Eccentric) - Side-Lying   Lie on side with knees bent.  Tie green band around knees.  Lift top knee, keeping feet together. Keep trunk steady. Slowly lower for 3 seconds. _15__ reps per set, _1__ sets per day, _6__ days per week.  Copyright  VHI. All rights reserved.  Walking on Toes   Walk on toes for _10___ feet while continuing on a straight path.  Do __4__ sessions per day.  Copyright  VHI. All rights reserved.  FUNCTIONAL MOBILITY: Heel Walking   Walk forward on heels. __10_ reps per set, _4__ sets per day, _6__ days per week Use assistive device.  Copyright  VHI. All rights reserved.  FUNCTIONAL MOBILITY: Marching - Standing   March in place by lifting left leg up, then right. Alternate.  Go forwards, then backwards _10__ feet per set, __4_ sets per day, __6_ days per week Hold onto a support.  Copyright  VHI. All rights reserved.  Walking Figure Eight   Practice walking in figure eight. Start with large figures. Make it more difficult by walking in a smaller figure eight.  Place cups on floor to walk around spaced apart 2-5 feet. Repeat _10___ times. Do ___1_ sessions per day.  http://gt2.exer.us/537   Copyright  VHI. All rights reserved.

## 2014-07-15 NOTE — Patient Instructions (Signed)
  Coordination Activities  Perform the following activities for 15 minutes 1-2 times per day with left hand(s).   Rotate ball in fingertips (clockwise and counter-clockwise).  Toss ball in air and catch with the same hand.  Flip cards 1 at a time as fast as you can.  Deal cards with your thumb (Hold deck in hand and push card off top with thumb).  Rotate card in hand (clockwise and counter-clockwise).  Pick up coins, buttons, marbles, dried beans/pasta of different sizes and place in container.  Pick up coins one at a time until you get 5-10 in your hand, then move coins from palm to fingertips to stack one at a time.  Screw together nuts and bolts, then unfasten.

## 2014-07-15 NOTE — Therapy (Signed)
[facility] [address] [place], [place], [address] Phone: [phone]   Fax:  [phone]  Physical Therapy Treatment  Patient Details  Name: Adam Waters MRN: [id number] Date of Birth: 12/02/1945 Referring Provider:  Asencion Noble, MD  Encounter Date: 07/15/2014      PT End of Session - 07/15/14 0859    Visit Number 2   Number of Visits 9   Date for PT Re-Evaluation 09/09/14   PT Start Time 0808   PT Stop Time 0849   PT Time Calculation (min) 41 min   Activity Tolerance Patient tolerated treatment well   Behavior During Therapy [facility] for tasks assessed/performed      Past Medical History  Diagnosis Date  . Heart failure   . Heart transplanted   . CAD (coronary artery disease), native artery transplanted heart   . Atrial fibrillation   . Chronic kidney disease, stage 3   . Hyperlipidemia   . Insomnia   . Skin cancer   . Cellulitis     ELBOW AND TOE  . Depression   . Anxiety   . Hypertension   . Stroke     2 strokes w/in 24 hrs of cardioversion    Past Surgical History  Procedure Laterality Date  . Heart transplant  2012    [facility]  . Coronary angioplasty with stent placement  01/2014    in transplanted heart at [facility]  . Knee surgery Left 2008  . Eye surgery Right 2009    [facility]    Filed Vitals:   07/15/14 0835  BP: 150/89  Pulse: 89    Visit Diagnosis:  Abnormality of gait  Weakness of left lower extremity      Subjective Assessment - 07/15/14 0811    Symptoms Patient reports joined [employer] but hasn't started yet.   How long can you walk comfortably? on average an hour in grocery store prior to fatigue, then start stumbling   Currently in Pain? No/denies            [facility] PT Assessment - 07/15/14 0814    ROM / Strength   AROM / PROM / Strength Strength   Strength   Strength Assessment Site Hip   Right Hip Extension 4-/5   Right Hip ABduction 4-/5   Left Hip Extension 3+/5    Left Hip ABduction 4-/5                   [facility] Adult PT Treatment/Exercise - 07/15/14 0001    High Level Balance   High Level Balance Activities Figure 8 turns;Marching forwards;Marching backwards   High Level Balance Comments heel walking and toe walking 2 laps near counter, tapping up to cones with single UE support   Exercises   Exercises Knee/Hip   Knee/Hip Exercises: Aerobic   Stationary Bike Nu Step level 3 UE/LE x 8 minutes   Knee/Hip Exercises: Supine   Bridges 1 set;15 reps;Both  5 sec hold   Straight Leg Raises 1 set;15 reps;Both   Knee/Hip Exercises: Sidelying   Clams w/green t-band x 15 each side                PT Education - 07/15/14 0858    Education provided Yes   Education Details HEP   Person(s) Educated Patient   Methods Explanation;Handout;Demonstration   Comprehension Returned demonstration;Need further instruction             PT Long Term Goals - 07/15/14  0902    PT LONG TERM GOAL #1   Title Pt will be independent with HEP for improved strength, balance, gait. (Target 08/10/14)   Time 4   Period Weeks   Status On-going   PT LONG TERM GOAL #2   Title Pt will improve Timed Up and Go score to less than or equal to 13.5 seconds for decreased fall risk.   Time 4   Period Weeks   Status On-going   PT LONG TERM GOAL #3   Title Pt will improve Dynamic Gait Index score to at least 19/24 for decreased fall risk.   Time 4   Period Weeks   Status On-going   PT LONG TERM GOAL #4   Title Pt will verbalize understanding of fall prevention within the home environment.   Time 4   Period Weeks   Status On-going   PT LONG TERM GOAL #5   Title Pt will demonstrate understanding of use of cane with gait, as evidenced by gait >500 ft on indoor/outdoor surfaces with cane, modified independently with no LOB.   Time 4   Period Weeks   Status New               Plan - 07/15/14 0900    Clinical Impression Statement Patient  demonstrates hip weakness L>R.  Able to complete HEP today for hip strength and  balance.  Patient tolerated well.  Will continue to benefit from skilled PT to progress to goals.   PT Next Visit Plan Gait with SPC, squencing/timing of LLE for improved foot clearance.  HEP review        Problem List Patient Active Problem List   Diagnosis Date Noted  . Unsteady gait 07/09/2014  . Abnormality of gait 05/13/2014  . Obstructive sleep apnea 05/13/2014  . Restless legs 05/13/2014  . Acute ischemic stroke 02/14/2014  . Stroke   . Heart transplanted   . CAD (coronary artery disease), native artery transplanted heart   . Chronic kidney disease, stage 3   . Hyperlipidemia     WYNN,CYNDI 07/15/2014, 9:09 AM  [place] [place] [place] [address] [place], [place], [address] Phone: [phone]   Fax:  [phone]

## 2014-07-15 NOTE — Therapy (Signed)
[facility] [address] [place], [place], [address] Phone: [phone]   Fax:  [phone]  Occupational Therapy Treatment  Patient Details  Name: Adam Waters MRN: [id number] Date of Birth: Aug 23, 1945 Referring Provider:  Asencion Noble, MD  Encounter Date: 07/15/2014      OT End of Session - 07/15/14 1307    Visit Number 2  G2   Number of Visits 9   Date for OT Re-Evaluation 08/09/14   Authorization Type MCR   Authorization Time Period 60 DAYS   OT Start Time 1233   OT Stop Time 1313   OT Time Calculation (min) 40 min   Activity Tolerance Patient tolerated treatment well      Past Medical History  Diagnosis Date  . Heart failure   . Heart transplanted   . CAD (coronary artery disease), native artery transplanted heart   . Atrial fibrillation   . Chronic kidney disease, stage 3   . Hyperlipidemia   . Insomnia   . Skin cancer   . Cellulitis     ELBOW AND TOE  . Depression   . Anxiety   . Hypertension   . Stroke     2 strokes w/in 24 hrs of cardioversion    Past Surgical History  Procedure Laterality Date  . Heart transplant  2012    [facility]  . Coronary angioplasty with stent placement  01/2014    in transplanted heart at [facility]  . Knee surgery Left 2008  . Eye surgery Right 2009    [facility]    There were no vitals filed for this visit.  Visit Diagnosis:  Lack of coordination due to stroke  Weakness of left upper extremity      Subjective Assessment - 07/15/14 1240    Symptoms Lt hemiparesis   Pertinent History multiple CVA's, Rt rotator cuff surgery (30 years ago), heart transplant 2012   Patient Stated Goals work on coordination and balance, I also want a job   Currently in Pain? No/denies                    OT Treatments/Exercises (OP) - 07/15/14 1242    Exercises   Exercises Shoulder   Shoulder Exercises: ROM/Strengthening   UBE (Upper Arm Bike) X 10 min. Level 4 resistance  for UE strength/endurance   Fine Motor Coordination   Other Fine Motor Exercises Issued coordination HEP. Pt demo each ex with occasional cueing to perform correctly. See pt instructions and education                OT Education - 07/15/14 1259    Education provided Yes   Education Details coordination HEP   Person(s) Educated Patient   Methods Explanation;Demonstration;Handout   Comprehension Verbalized understanding;Returned demonstration             OT Long Term Goals - 07/10/14 1120    OT LONG TERM GOAL #1   Title Independent with HEP for coordination and UE strengthening on Lt (due 08/09/14)   Time 4   Period Weeks   Status New   OT LONG TERM GOAL #2   Title Improve coordination as evidenced by reducing speed on 9 hole peg test to 34 sec. or less   Baseline Lt = 40.53 sec.    Time 4   Period Weeks   Status New   OT LONG TERM GOAL #3   Title Pt to perform 15 min. of intense physical activity for  simulated yard work w/o [place] demonstrating good safety awareness   Time [place] - 07/15/14 1308    Clinical Impression Statement Pt tolerating fine motor coordination ex's with difficulty for in hand translation activities.    Plan LUE strengthening HEP (Theraband)        Problem List Patient Active Problem List   Diagnosis Date Noted  . Unsteady gait 07/09/2014  . Abnormality of gait 05/13/2014  . Obstructive sleep apnea 05/13/2014  . Restless legs 05/13/2014  . Acute ischemic stroke 02/14/2014  . Stroke   . Heart transplanted   . CAD (coronary artery disease), native artery transplanted heart   . Chronic kidney disease, stage 3   . Hyperlipidemia     Carey Bullocks, OTR/L 07/15/2014, 1:10 PM  [facility] [address] [place], [place], [address] Phone: [phone]   Fax:  [phone]

## 2014-07-15 NOTE — Telephone Encounter (Signed)
The patient's wife was contacted and provided the results of her husbands sleep study that revealed only mild sleep apnea.  She was notified that a CPAP titration study could be arranged for treatment.  She was informed that a follow up visit with Dr. Rexene Alberts could also be scheduled if they would like to discuss the results in more detail.  Until such time she was informed for him to continue to follow up with Dr. Leonie Man has previously scheduled.  Dr. Leonie Man was routed a copy of the results.

## 2014-07-16 ENCOUNTER — Telehealth: Payer: Self-pay | Admitting: Radiology

## 2014-07-16 ENCOUNTER — Ambulatory Visit: Payer: Medicare Other | Admitting: Occupational Therapy

## 2014-07-16 DIAGNOSIS — I6980 Unspecified sequelae of other cerebrovascular disease: Secondary | ICD-10-CM | POA: Diagnosis not present

## 2014-07-16 DIAGNOSIS — R29898 Other symptoms and signs involving the musculoskeletal system: Secondary | ICD-10-CM

## 2014-07-16 DIAGNOSIS — IMO0002 Reserved for concepts with insufficient information to code with codable children: Secondary | ICD-10-CM

## 2014-07-16 DIAGNOSIS — R279 Unspecified lack of coordination: Secondary | ICD-10-CM | POA: Diagnosis not present

## 2014-07-16 DIAGNOSIS — R6889 Other general symptoms and signs: Secondary | ICD-10-CM | POA: Diagnosis not present

## 2014-07-16 DIAGNOSIS — R269 Unspecified abnormalities of gait and mobility: Secondary | ICD-10-CM | POA: Diagnosis not present

## 2014-07-16 NOTE — Patient Instructions (Signed)
  Strengthening: Resisted Flexion   Hold tubing with __Lt___ arm(s) at side. Pull forward and up. Move shoulder through pain-free range of motion. Repeat __15__ times per set.  Do _1-2_ sessions per day , every other day   Strengthening: Resisted Extension   Hold tubing in _Lt____ hand(s), arm forward. Pull arm back, elbow straight. Repeat _15___ times per set. Do _1-2___ sessions per day, every other day.   Resisted Horizontal Abduction: Bilateral   Sit or stand, tubing in both hands, arms out in front. Keeping arms straight, pinch shoulder blades together and stretch arms out. Repeat _15___ times per set. Do _1-2___ sessions per day, every other day.   Elbow Flexion: Resisted   With tubing held in __Lt____ hand(s) and other end secured under foot, curl arm up as far as possible. Repeat _15__ times per set. Do _1-2___ sessions per day, every other day.    Elbow Extension: Resisted   Sit in chair with resistive band secured at armrest (or hold with other hand) and __Lt _____ elbow bent. Straighten elbow. Repeat _15___ times per set.  Do _1-2___ sessions per day, every other day.   Copyright  VHI. All rights reserved.

## 2014-07-16 NOTE — Therapy (Signed)
[facility] [address] [place], [place], [address] Phone: [phone]   Fax:  [phone]  Occupational Therapy Treatment  Patient Details  Name: Adam Waters MRN: [id number] Date of Birth: 10/12/1945 Referring Provider:  Asencion Noble, MD  Encounter Date: 07/16/2014      OT End of Session - 07/16/14 1135    Visit Number 3  G3   Number of Visits 9   Date for OT Re-Evaluation 08/09/14   Authorization Type MCR   Authorization Time Period 60 DAYS   OT Start Time 1100   OT Stop Time 1145   OT Time Calculation (min) 45 min   Activity Tolerance Patient tolerated treatment well      Past Medical History  Diagnosis Date  . Heart failure   . Heart transplanted   . CAD (coronary artery disease), native artery transplanted heart   . Atrial fibrillation   . Chronic kidney disease, stage 3   . Hyperlipidemia   . Insomnia   . Skin cancer   . Cellulitis     ELBOW AND TOE  . Depression   . Anxiety   . Hypertension   . Stroke     2 strokes w/in 24 hrs of cardioversion    Past Surgical History  Procedure Laterality Date  . Heart transplant  2012    [facility]  . Coronary angioplasty with stent placement  01/2014    in transplanted heart at [facility]  . Knee surgery Left 2008  . Eye surgery Right 2009    [facility]    There were no vitals filed for this visit.  Visit Diagnosis:  Lack of coordination due to stroke  Weakness of left upper extremity      Subjective Assessment - 07/16/14 1105    Symptoms Lt hemiparesis   Pertinent History multiple CVA's, Rt rotator cuff surgery (30 years ago), heart transplant 2012   Patient Stated Goals work on coordination and balance, I also want a job   Currently in Pain? No/denies                    OT Treatments/Exercises (OP) - 07/16/14 1105    Shoulder Exercises: ROM/Strengthening   UBE (Upper Arm Bike) X 10 min. Level 4 resistance for UE strength/endurance   Other  ROM/Strengthening Exercises theraband HEP issued (red resistance) in shoulder flexion, shoulder extension, horizontal abduction, elbow flexion, elbow extension. Pt performed each x 15 reps (see pt instructions)   Fine Motor Coordination   Other Fine Motor Exercises Placing O'Connor pegs in pegboard with tweezers using Lt hand with mod to max difficulty and drops. Pt then switched to using hand w/o tweezers w/ mod difficulty. Pt then removing pegs with tweezers with only mild difficulty                OT Education - 07/16/14 1120    Education provided Yes   Education Details Theraband HEP (red)   Person(s) Educated Patient   Methods Explanation;Demonstration;Handout   Comprehension Verbalized understanding;Returned demonstration             OT Long Term Goals - 07/16/14 1123    OT LONG TERM GOAL #1   Title Independent with HEP for coordination and UE strengthening on Lt (due 08/09/14)   Time 4   Period Weeks   Status Achieved   OT LONG TERM GOAL #2   Title Improve coordination as evidenced by reducing speed on 9 hole peg test to  34 sec. or less   Baseline Lt = 40.53 sec.    Time 4   Period Weeks   Status On-going   OT LONG TERM GOAL #3   Title Pt to perform 15 min. of intense physical activity for simulated yard work w/o LOB demonstrating good safety awareness   Time 4   Period Weeks   Status On-going               Plan - 07/16/14 1136    Clinical Impression Statement Pt met LTG #1. Approximating other LTG's.    Plan work on LTG #3        Problem List Patient Active Problem List   Diagnosis Date Noted  . Unsteady gait 07/09/2014  . Abnormality of gait 05/13/2014  . Obstructive sleep apnea 05/13/2014  . Restless legs 05/13/2014  . Acute ischemic stroke 02/14/2014  . Stroke   . Heart transplanted   . CAD (coronary artery disease), native artery transplanted heart   . Chronic kidney disease, stage 3   . Hyperlipidemia     Carey Bullocks,  OTR/L 07/16/2014, 11:42 AM  [facility] [address] [place], [place], [address] Phone: [phone]   Fax:  [phone]

## 2014-07-16 NOTE — Telephone Encounter (Signed)
Left voice message to call our office regarding scheduling the Carotid and TCD complete doppler studies.

## 2014-07-17 ENCOUNTER — Ambulatory Visit: Payer: Medicare Other | Admitting: Physical Therapy

## 2014-07-17 ENCOUNTER — Encounter: Payer: Medicare Other | Admitting: *Deleted

## 2014-07-18 ENCOUNTER — Encounter: Payer: Medicare Other | Admitting: Occupational Therapy

## 2014-07-18 ENCOUNTER — Ambulatory Visit: Payer: Medicare Other | Admitting: *Deleted

## 2014-07-22 ENCOUNTER — Ambulatory Visit: Payer: Medicare Other | Admitting: Physical Therapy

## 2014-07-24 ENCOUNTER — Ambulatory Visit: Payer: Medicare Other | Admitting: Physical Therapy

## 2014-07-25 ENCOUNTER — Ambulatory Visit: Payer: Medicare Other | Admitting: Physical Therapy

## 2014-07-26 ENCOUNTER — Ambulatory Visit: Payer: Medicare Other | Admitting: Physical Therapy

## 2014-07-26 DIAGNOSIS — L570 Actinic keratosis: Secondary | ICD-10-CM | POA: Diagnosis not present

## 2014-07-26 DIAGNOSIS — D485 Neoplasm of uncertain behavior of skin: Secondary | ICD-10-CM | POA: Diagnosis not present

## 2014-07-26 DIAGNOSIS — Z85828 Personal history of other malignant neoplasm of skin: Secondary | ICD-10-CM | POA: Diagnosis not present

## 2014-07-29 ENCOUNTER — Ambulatory Visit: Payer: Medicare Other | Attending: Neurology

## 2014-07-29 ENCOUNTER — Ambulatory Visit: Payer: Medicare Other | Admitting: Occupational Therapy

## 2014-07-29 DIAGNOSIS — R269 Unspecified abnormalities of gait and mobility: Secondary | ICD-10-CM | POA: Insufficient documentation

## 2014-07-29 DIAGNOSIS — C44212 Basal cell carcinoma of skin of right ear and external auricular canal: Secondary | ICD-10-CM | POA: Diagnosis not present

## 2014-07-29 DIAGNOSIS — D044 Carcinoma in situ of skin of scalp and neck: Secondary | ICD-10-CM | POA: Diagnosis not present

## 2014-07-29 DIAGNOSIS — R279 Unspecified lack of coordination: Secondary | ICD-10-CM | POA: Insufficient documentation

## 2014-07-29 DIAGNOSIS — I6980 Unspecified sequelae of other cerebrovascular disease: Secondary | ICD-10-CM | POA: Insufficient documentation

## 2014-07-29 DIAGNOSIS — R29898 Other symptoms and signs involving the musculoskeletal system: Secondary | ICD-10-CM | POA: Insufficient documentation

## 2014-07-29 DIAGNOSIS — R6889 Other general symptoms and signs: Secondary | ICD-10-CM | POA: Insufficient documentation

## 2014-07-31 ENCOUNTER — Encounter: Payer: Self-pay | Admitting: Physical Therapy

## 2014-07-31 ENCOUNTER — Ambulatory Visit: Payer: Medicare Other | Admitting: Physical Therapy

## 2014-07-31 ENCOUNTER — Ambulatory Visit: Payer: Medicare Other | Admitting: Occupational Therapy

## 2014-07-31 ENCOUNTER — Encounter: Payer: Self-pay | Admitting: Occupational Therapy

## 2014-07-31 ENCOUNTER — Ambulatory Visit (INDEPENDENT_AMBULATORY_CARE_PROVIDER_SITE_OTHER): Payer: Medicare Other

## 2014-07-31 DIAGNOSIS — R6889 Other general symptoms and signs: Secondary | ICD-10-CM | POA: Diagnosis not present

## 2014-07-31 DIAGNOSIS — R269 Unspecified abnormalities of gait and mobility: Secondary | ICD-10-CM | POA: Diagnosis not present

## 2014-07-31 DIAGNOSIS — R2681 Unsteadiness on feet: Secondary | ICD-10-CM

## 2014-07-31 DIAGNOSIS — Z0289 Encounter for other administrative examinations: Secondary | ICD-10-CM

## 2014-07-31 DIAGNOSIS — I6320 Cerebral infarction due to unspecified occlusion or stenosis of unspecified precerebral arteries: Secondary | ICD-10-CM

## 2014-07-31 DIAGNOSIS — I6980 Unspecified sequelae of other cerebrovascular disease: Secondary | ICD-10-CM | POA: Diagnosis not present

## 2014-07-31 DIAGNOSIS — R29898 Other symptoms and signs involving the musculoskeletal system: Secondary | ICD-10-CM

## 2014-07-31 DIAGNOSIS — R279 Unspecified lack of coordination: Secondary | ICD-10-CM | POA: Diagnosis not present

## 2014-07-31 NOTE — Therapy (Signed)
[facility] [address] [place], [place], [address] Phone: [phone]   Fax:  [phone]  Occupational Therapy Treatment  Patient Details  Name: Adam Waters MRN: [id number] Date of Birth: 20-Dec-1945 Referring Provider:  Asencion Noble, MD  Encounter Date: 07/31/2014      OT End of Session - 07/31/14 0847    Visit Number 4  G4   Number of Visits 9   Date for OT Re-Evaluation 08/09/14   Authorization Type MCR   Authorization Time Period 24 DAYS   OT Start Time 0800   OT Stop Time 0843   OT Time Calculation (min) 43 min   Activity Tolerance Patient tolerated treatment well      Past Medical History  Diagnosis Date  . Heart failure   . Heart transplanted   . CAD (coronary artery disease), native artery transplanted heart   . Atrial fibrillation   . Chronic kidney disease, stage 3   . Hyperlipidemia   . Insomnia   . Skin cancer   . Cellulitis     ELBOW AND TOE  . Depression   . Anxiety   . Hypertension   . Stroke     2 strokes w/in 24 hrs of cardioversion    Past Surgical History  Procedure Laterality Date  . Heart transplant  2012    [facility]  . Coronary angioplasty with stent placement  01/2014    in transplanted heart at [facility]  . Knee surgery Left 2008  . Eye surgery Right 2009    [facility]    There were no vitals filed for this visit.  Visit Diagnosis:  Decreased functional activity tolerance      Subjective Assessment - 07/31/14 0804    Subjective  "I climbed up a 20 ft ladder. My theraband exercises are going well"   Pertinent History multiple CVA's, Rt rotator cuff surgery (30 years ago), heart transplant 2012   Limitations Lt hemiparesis (mild)   Patient Stated Goals work on coordination and balance, I also want a job   Currently in Pain? No/denies                    OT Treatments/Exercises (OP) - 07/31/14 0840    ADLs   Functional Mobility Pt carrying full laundry basket 300  + ft. Pt also placing on floor to tabletop surface x 10 reps; and picking up items off floor multiple x's.    Home Maintenance Pt simulated making bed, folding sheet, and sweeping floor    ADL Comments Above activities performed for 25 min. w/o rest   Shoulder Exercises: ROM/Strengthening   UBE (Upper Arm Bike) X 10 min. Level 5 resistance for UE strength/endurance                     OT Long Term Goals - 07/31/14 0848    OT LONG TERM GOAL #1   Title Independent with HEP for coordination and UE strengthening on Lt (due 08/09/14)   Time 4   Period Weeks   Status Achieved   OT LONG TERM GOAL #2   Title Improve coordination as evidenced by reducing speed on 9 hole peg test to 34 sec. or less   Baseline Lt = 40.53 sec.    Time 4   Period Weeks   Status On-going   OT LONG TERM GOAL #3   Title Pt to perform 15 min. of intense physical activity for simulated yard work w/o  LOB demonstrating good safety awareness   Time 4   Period Weeks   Status Achieved               Plan - 07/31/14 0848    Clinical Impression Statement Pt met LTG #3. Pt demo adequate endurance for 25 min. of activity w/o rest or LOB   Plan Continue LTG #2        Problem List Patient Active Problem List   Diagnosis Date Noted  . Unsteady gait 07/09/2014  . Abnormality of gait 05/13/2014  . Obstructive sleep apnea 05/13/2014  . Restless legs 05/13/2014  . Acute ischemic stroke 02/14/2014  . Stroke   . Heart transplanted   . CAD (coronary artery disease), native artery transplanted heart   . Chronic kidney disease, stage 3   . Hyperlipidemia     Carey Bullocks, OTR/L 07/31/2014, 8:50 AM  [facility] [address] [place], [place], [address] Phone: [phone]   Fax:  [phone]

## 2014-07-31 NOTE — Patient Instructions (Signed)
ANKLE: Dorsiflexion (Band)   Sit at edge of surface. Place band around top of foot. Keeping heel on floor, raise toes of banded foot. Hold 2 seconds. Use blue band. 15 reps per set, 3 sets per day.  Copyright  VHI. All rights reserved.

## 2014-08-01 ENCOUNTER — Ambulatory Visit: Payer: Medicare Other | Admitting: *Deleted

## 2014-08-01 ENCOUNTER — Encounter: Payer: Medicare Other | Admitting: Occupational Therapy

## 2014-08-01 NOTE — Therapy (Signed)
[facility] [address] [place], [place], [address] Phone: [phone]   Fax:  [phone]  Physical Therapy Treatment  Patient Details  Name: Adam Waters MRN: [id number] Date of Birth: May 04, 1945 Referring Provider:  Asencion Noble, MD  Encounter Date: 07/31/2014      PT End of Session - 08/01/14 1219    Visit Number 3   Number of Visits 9   Date for PT Re-Evaluation 09/09/14   PT Start Time 0847   PT Stop Time 0930   PT Time Calculation (min) 43 min   Activity Tolerance Patient tolerated treatment well   Behavior During Therapy [facility] for tasks assessed/performed      Past Medical History  Diagnosis Date  . Heart failure   . Heart transplanted   . CAD (coronary artery disease), native artery transplanted heart   . Atrial fibrillation   . Chronic kidney disease, stage 3   . Hyperlipidemia   . Insomnia   . Skin cancer   . Cellulitis     ELBOW AND TOE  . Depression   . Anxiety   . Hypertension   . Stroke     2 strokes w/in 24 hrs of cardioversion    Past Surgical History  Procedure Laterality Date  . Heart transplant  2012    [facility]  . Coronary angioplasty with stent placement  01/2014    in transplanted heart at [facility]  . Knee surgery Left 2008  . Eye surgery Right 2009    [facility]    There were no vitals filed for this visit.  Visit Diagnosis:  Weakness of left lower extremity  Abnormality of gait      Subjective Assessment - 07/31/14 0905    Subjective Pt reports going to [employer] 4x/week-using weight machines for arms and legs, treadmill @ 2.0 mph x 30 minutes begining then 30 minutes at end of session.   Pertinent History 3 ruptured vertebrae in neck; history of heart transplant   How long can you walk comfortably? on average an hour in grocery store prior to fatigue, then start stumbling   Patient Stated Goals Pt's goal for therapy is to improve stumbling and help with walking.   Currently in Pain? No/denies                       [facility] Adult PT Treatment/Exercise - 08/01/14 1213    Transfers   Transfers Sit to Stand;Stand to Sit   Sit to Stand 5: Supervision;Without upper extremity assist;From bed;Five times sit to stand   Stand to Sit 5: Supervision;Without upper extremity assist;To chair/3-in-1   Ambulation/Gait   Ambulation/Gait Yes   Ambulation/Gait Assistance 5: Supervision   Ambulation Distance (Feet) 150 Feet  twice   Assistive device None   Gait Pattern Step-through pattern;Decreased step length - left;Decreased stride length;Decreased hip/knee flexion - left;Decreased dorsiflexion - left;Trendelenburg;Poor foot clearance - left   High Level Balance   High Level Balance Comments heel walking and toe walking 2 laps near counter, tapping up to cones with single UE support   Knee/Hip Exercises: Standing   Heel Raises 10 reps   Other Standing Knee Exercises toes raises x 10   Knee/Hip Exercises: Seated   Other Seated Knee Exercises ankle dorsiflexion with blue band x 15 reps on L LE   Knee/Hip Exercises: Supine   Bridges Both;15 reps   Straight Leg Raises Both;15 reps  3# weight   Knee/Hip Exercises: Sidelying  Clams w/green t-band x 15 each side                PT Education - 08/01/14 1219    Education provided Yes   Education Details HEP   Person(s) Educated Patient   Methods Explanation;Demonstration;Handout   Comprehension Verbalized understanding;Returned demonstration             PT Long Term Goals - 07/15/14 0902    PT LONG TERM GOAL #1   Title Pt will be independent with HEP for improved strength, balance, gait. (Target 08/10/14)   Time 4   Period Weeks   Status On-going   PT LONG TERM GOAL #2   Title Pt will improve Timed Up and Go score to less than or equal to 13.5 seconds for decreased fall risk.   Time 4   Period Weeks   Status On-going   PT LONG TERM GOAL #3   Title Pt will improve Dynamic Gait  Index score to at least 19/24 for decreased fall risk.   Time 4   Period Weeks   Status On-going   PT LONG TERM GOAL #4   Title Pt will verbalize understanding of fall prevention within the home environment.   Time 4   Period Weeks   Status On-going   PT LONG TERM GOAL #5   Title Pt will demonstrate understanding of use of cane with gait, as evidenced by gait >500 ft on indoor/outdoor surfaces with cane, modified independently with no LOB.   Time 4   Period Weeks   Status New               Plan - 08/01/14 1219    Clinical Impression Statement Continue PT per POC.   Pt will benefit from skilled therapeutic intervention in order to improve on the following deficits Abnormal gait;Decreased coordination;Decreased balance;Decreased mobility;Decreased knowledge of use of DME;Decreased strength;Difficulty walking   Rehab Potential Good   PT Frequency 2x / week   PT Duration 4 weeks   PT Treatment/Interventions ADLs/Self Care Home Management;Functional mobility training;Gait training;DME Instruction;Therapeutic activities;Therapeutic exercise;Balance training;Neuromuscular re-education;Patient/family education   PT Next Visit Plan Gait with SPC (althought pt hesitant to use it), balance actvities on compliant surfaces   Consulted and Agree with Plan of Care Patient        Problem List Patient Active Problem List   Diagnosis Date Noted  . Unsteady gait 07/09/2014  . Abnormality of gait 05/13/2014  . Obstructive sleep apnea 05/13/2014  . Restless legs 05/13/2014  . Acute ischemic stroke 02/14/2014  . Stroke   . Heart transplanted   . CAD (coronary artery disease), native artery transplanted heart   . Chronic kidney disease, stage 3   . Hyperlipidemia     Adam Waters 08/01/2014, 12:21 PM  [facility] [address] [place], [place], [address] Phone: [phone]   Fax:  [place], [place] [facility] 08/01/2014 12:21 PM Phone: [phone] Fax: [phone]

## 2014-08-05 ENCOUNTER — Encounter: Payer: Self-pay | Admitting: Occupational Therapy

## 2014-08-05 ENCOUNTER — Ambulatory Visit: Payer: Medicare Other | Admitting: Physical Therapy

## 2014-08-05 ENCOUNTER — Ambulatory Visit: Payer: Medicare Other | Admitting: Occupational Therapy

## 2014-08-05 DIAGNOSIS — R269 Unspecified abnormalities of gait and mobility: Secondary | ICD-10-CM | POA: Diagnosis not present

## 2014-08-05 DIAGNOSIS — IMO0002 Reserved for concepts with insufficient information to code with codable children: Secondary | ICD-10-CM

## 2014-08-05 DIAGNOSIS — I6980 Unspecified sequelae of other cerebrovascular disease: Secondary | ICD-10-CM | POA: Diagnosis not present

## 2014-08-05 DIAGNOSIS — R6889 Other general symptoms and signs: Secondary | ICD-10-CM | POA: Diagnosis not present

## 2014-08-05 DIAGNOSIS — R29898 Other symptoms and signs involving the musculoskeletal system: Secondary | ICD-10-CM | POA: Diagnosis not present

## 2014-08-05 DIAGNOSIS — R279 Unspecified lack of coordination: Secondary | ICD-10-CM | POA: Diagnosis not present

## 2014-08-05 NOTE — Therapy (Signed)
[facility] [address] [place], [place], [address] Phone: [phone]   Fax:  [phone]  Occupational Therapy Treatment  Patient Details  Name: Adam Waters MRN: [id number] Date of Birth: 09/03/1945 Referring Provider:  Asencion Noble, MD  Encounter Date: 08/05/2014      OT End of Session - 08/05/14 1408    Visit Number 5  G5   Number of Visits 9   Date for OT Re-Evaluation 08/09/14   Authorization Type MCR   Authorization Time Period 60 DAYS   OT Start Time 1402   OT Stop Time 1445   OT Time Calculation (min) 43 min   Activity Tolerance Patient tolerated treatment well      Past Medical History  Diagnosis Date  . Heart failure   . Heart transplanted   . CAD (coronary artery disease), native artery transplanted heart   . Atrial fibrillation   . Chronic kidney disease, stage 3   . Hyperlipidemia   . Insomnia   . Skin cancer   . Cellulitis     ELBOW AND TOE  . Depression   . Anxiety   . Hypertension   . Stroke     2 strokes w/in 24 hrs of cardioversion    Past Surgical History  Procedure Laterality Date  . Heart transplant  2012    [facility]  . Coronary angioplasty with stent placement  01/2014    in transplanted heart at [facility]  . Knee surgery Left 2008  . Eye surgery Right 2009    [facility]    There were no vitals filed for this visit.  Visit Diagnosis:  Lack of coordination due to stroke  Decreased functional activity tolerance      Subjective Assessment - 08/05/14 1406    Subjective  "They are trying to figure out why I'm having so many strokes."   Pertinent History multiple CVA's, Rt rotator cuff surgery (30 years ago), heart transplant 2012   Limitations Lt hemiparesis (mild)   Patient Stated Goals work on coordination and balance, I also want a job   Currently in Pain? No/denies                    OT Treatments/Exercises (OP) - 08/05/14 0001    Shoulder Exercises: Seated   Other Seated Exercises Arm bike x65min level 10 for conditioning without rest.   Fine Motor Coordination   Other Fine Motor Exercises Placing grooved pegs in pegboard for increased coordination with mod difficulty.   Other Fine Motor Exercises Completing Purdue pegboard with min difficulty, particularly picking up washers with L hand for increased coordination.   Functional Reaching Activities   High Level to place small pegs in vertical pegboard for increased coordination and activity tolerance with 2lb wt on L wrist for increased activity tolerance.  Removing using in-hand manipulation with min difficulty.                     OT Long Term Goals - 07/31/14 0848    OT LONG TERM GOAL #1   Title Independent with HEP for coordination and UE strengthening on Lt (due 08/09/14)   Time 4   Period Weeks   Status Achieved   OT LONG TERM GOAL #2   Title Improve coordination as evidenced by reducing speed on 9 hole peg test to 34 sec. or less   Baseline Lt = 40.53 sec.    Time 4   Period Weeks  Status On-going   OT LONG TERM GOAL #3   Title Pt to perform 15 min. of intense physical activity for simulated yard work w/o LOB demonstrating good safety awareness   Time 4   Period Weeks   Status Achieved               Plan - 08/05/14 1417    Clinical Impression Statement Pt progressing toward remaining goal.   Plan continue to address coordination   Consulted and Agree with Plan of Care Patient        Problem List Patient Active Problem List   Diagnosis Date Noted  . Unsteady gait 07/09/2014  . Abnormality of gait 05/13/2014  . Obstructive sleep apnea 05/13/2014  . Restless legs 05/13/2014  . Acute ischemic stroke 02/14/2014  . Stroke   . Heart transplanted   . CAD (coronary artery disease), native artery transplanted heart   . Chronic kidney disease, stage 3   . Hyperlipidemia     [facility] 08/05/2014, 2:45 PM  [facility] [address] [place], [place], [address] Phone: [phone]   Fax:  [place], OTR/L 08/05/2014 2:46 PM

## 2014-08-07 ENCOUNTER — Ambulatory Visit: Payer: Medicare Other | Admitting: Occupational Therapy

## 2014-08-07 ENCOUNTER — Ambulatory Visit: Payer: Medicare Other | Admitting: Physical Therapy

## 2014-08-07 DIAGNOSIS — R29898 Other symptoms and signs involving the musculoskeletal system: Secondary | ICD-10-CM | POA: Diagnosis not present

## 2014-08-07 DIAGNOSIS — R279 Unspecified lack of coordination: Secondary | ICD-10-CM | POA: Diagnosis not present

## 2014-08-07 DIAGNOSIS — R6889 Other general symptoms and signs: Secondary | ICD-10-CM | POA: Diagnosis not present

## 2014-08-07 DIAGNOSIS — IMO0002 Reserved for concepts with insufficient information to code with codable children: Secondary | ICD-10-CM

## 2014-08-07 DIAGNOSIS — I6980 Unspecified sequelae of other cerebrovascular disease: Secondary | ICD-10-CM | POA: Diagnosis not present

## 2014-08-07 DIAGNOSIS — R269 Unspecified abnormalities of gait and mobility: Secondary | ICD-10-CM

## 2014-08-07 NOTE — Patient Instructions (Signed)
Step-Up: Forward   Leading with right leg, bring both feet onto _6___ inch step. Return to starting position, leading with left leg. Repeat __2 sets of 10__ times per session. Do __1-2_ sessions per day. Make sure to clear your left foot with putting your foot on the step and taking it off the step.  Copyright  VHI. All rights reserved.  Side-Stepping   Walk to left side with eyes open. Take even steps, leading with same foot. Be deliberate to make sure each foot lifts off the floor. Repeat in opposite direction. Repeat for _3 times the length of the counter. Do __1-2 sessions per day.  Copyright  VHI. All rights reserved.   Copyright  VHI. All rights reserved.  Feet Heel-Toe "Tandem"   Arms outstretched or by your side, walk a straight line bringing one foot directly in front of the other.  You can also do this by marching and placing one foot in front of the other. Repeat for _3 x 10 ft___ each session. Do 1-2____ sessions per day.  Copyright  VHI. All rights reserved.  Walking on Heels   Walk on heels for 3 x 10 _ feet while continuing on a straight path.  Make sure to clear your left. Do _1-2__ sessions per day.  Copyright  VHI. All rights reserved.

## 2014-08-07 NOTE — Therapy (Signed)
[facility] [address] [place], [place], [address] Phone: [phone]   Fax:  [phone]  Physical Therapy Treatment  Patient Details  Name: Adam Waters MRN: [id number] Date of Birth: 06-Jun-1945 Referring Provider:  Asencion Noble, MD  Encounter Date: 08/07/2014      PT End of Session - 08/07/14 1238    Visit Number 4   Number of Visits 9   Date for PT Re-Evaluation 09/09/14   PT Start Time 5284   PT Stop Time 1231   PT Time Calculation (min) 44 min   Activity Tolerance Patient tolerated treatment well   Behavior During Therapy [facility] for tasks assessed/performed      Past Medical History  Diagnosis Date  . Heart failure   . Heart transplanted   . CAD (coronary artery disease), native artery transplanted heart   . Atrial fibrillation   . Chronic kidney disease, stage 3   . Hyperlipidemia   . Insomnia   . Skin cancer   . Cellulitis     ELBOW AND TOE  . Depression   . Anxiety   . Hypertension   . Stroke     2 strokes w/in 24 hrs of cardioversion    Past Surgical History  Procedure Laterality Date  . Heart transplant  2012    [facility]  . Coronary angioplasty with stent placement  01/2014    in transplanted heart at [facility]  . Knee surgery Left 2008  . Eye surgery Right 2009    [facility]    There were no vitals filed for this visit.  Visit Diagnosis:  Weakness of left lower extremity  Abnormality of gait  Lack of coordination due to stroke      Subjective Assessment - 08/07/14 1149    Subjective Had a fall yesterday going downhill outside.  Didn't get hurt.   Currently in Pain? No/denies        Gait training on ramp for incline/decline training simulating uneven terrain at his home:  Practiced with cues given for upright posture and deliberate heelstrike when going downhill.  Gait training trial with cane x 350 ft. With initial verbal and tactile cues for sequence.  Pt has several episodes of L foot  catching with gait.  Neuro Re-education:  At 6" step-forward step taps to 12 inch step>runner's stretch position, x 10 reps each leg, pt needing cues to avoid crossing feet in runner's stretch position; forward step-ups to 6 inch step x 10 reps.  At counter-activities to improve deliberate foot clearance, with cues given throughout activities:  Forward/back marching, forward/back tandem gait, forward/back tandem march, heel walking, then sidestepping, at least 3 x 10 ft each direction for each activity.  On compliant surface-forward step taps x 8 reps, side step taps x 8 reps, back step taps x 8 reps, with cues for improved LLE foot clearance.                        PT Education - 08/07/14 1236    Education provided Yes   Education Details HEP-balance activities at counter, forward step ups; cues/need to be deliberate with L foot clearance   Person(s) Educated Patient   Methods Explanation;Demonstration;Handout   Comprehension Verbalized understanding;Returned demonstration;Need further instruction             PT Long Term Goals - 07/15/14 0902    PT LONG TERM GOAL #1   Title Pt will be independent with HEP  for improved strength, balance, gait. (Target 08/10/14)   Time 4   Period Weeks   Status On-going   PT LONG TERM GOAL #2   Title Pt will improve Timed Up and Go score to less than or equal to 13.5 seconds for decreased fall risk.   Time 4   Period Weeks   Status On-going   PT LONG TERM GOAL #3   Title Pt will improve Dynamic Gait Index score to at least 19/24 for decreased fall risk.   Time 4   Period Weeks   Status On-going   PT LONG TERM GOAL #4   Title Pt will verbalize understanding of fall prevention within the home environment.   Time 4   Period Weeks   Status On-going   PT LONG TERM GOAL #5   Title Pt will demonstrate understanding of use of cane with gait, as evidenced by gait >500 ft on indoor/outdoor surfaces with cane, modified independently  with no LOB.   Time 4   Period Weeks   Status New               Plan - 08/07/14 1238    Clinical Impression Statement With cane trial and without cane, pt demonstrates decreased foot clearance at times with L foot, with L foot catching during gait.  Kasandra Knudsen may be somewhat helpful, but pt continues to have occasional difficulty clearing L foot.     Pt will benefit from skilled therapeutic intervention in order to improve on the following deficits Abnormal gait;Decreased coordination;Decreased balance;Decreased mobility;Decreased knowledge of use of DME;Decreased strength;Difficulty walking   Rehab Potential Good   PT Frequency 2x / week   PT Duration 4 weeks   PT Treatment/Interventions ADLs/Self Care Home Management;Functional mobility training;Gait training;DME Instruction;Therapeutic activities;Therapeutic exercise;Balance training;Neuromuscular re-education;Patient/family education   PT Next Visit Plan possibly try ankle weight on LLE for improved proprioception/foot placement with gait; compliant surface balance training   Consulted and Agree with Plan of Care Patient        Problem List Patient Active Problem List   Diagnosis Date Noted  . Unsteady gait 07/09/2014  . Abnormality of gait 05/13/2014  . Obstructive sleep apnea 05/13/2014  . Restless legs 05/13/2014  . Acute ischemic stroke 02/14/2014  . Stroke   . Heart transplanted   . CAD (coronary artery disease), native artery transplanted heart   . Chronic kidney disease, stage 3   . Hyperlipidemia     Shigeko Manard W. 08/07/2014, 12:46 PM Mady Haagensen, PT 08/07/2014 12:52 PM Phone: [phone] Fax: [facility] [address] [place], [place], [address] Phone: [phone]   Fax:  [phone]

## 2014-08-07 NOTE — Therapy (Signed)
[facility] [address] [place], [place], [address] Phone: [phone]   Fax:  [phone]  Occupational Therapy Treatment  Patient Details  Name: Adam Waters MRN: [id number] Date of Birth: 04-24-1946 Referring Provider:  Asencion Noble, MD  Encounter Date: 08/07/2014      OT End of Session - 08/07/14 1321    Visit Number 6  G   Number of Visits 9   Date for OT Re-Evaluation 08/16/14   Authorization Type MCR   Authorization Time Period 60 DAYS - week 3/4   OT Start Time 1235   OT Stop Time 1315   OT Time Calculation (min) 40 min   Activity Tolerance Patient tolerated treatment well      Past Medical History  Diagnosis Date  . Heart failure   . Heart transplanted   . CAD (coronary artery disease), native artery transplanted heart   . Atrial fibrillation   . Chronic kidney disease, stage 3   . Hyperlipidemia   . Insomnia   . Skin cancer   . Cellulitis     ELBOW AND TOE  . Depression   . Anxiety   . Hypertension   . Stroke     2 strokes w/in 24 hrs of cardioversion    Past Surgical History  Procedure Laterality Date  . Heart transplant  2012    [facility]  . Coronary angioplasty with stent placement  01/2014    in transplanted heart at [facility]  . Knee surgery Left 2008  . Eye surgery Right 2009    [facility]    There were no vitals filed for this visit.  Visit Diagnosis:  Lack of coordination due to stroke  Decreased functional activity tolerance      Subjective Assessment - 08/07/14 1241    Subjective  "I fell yesterday chasing a cow" (pt with no pain)   Pertinent History multiple CVA's, Rt rotator cuff surgery (30 years ago), heart transplant 2012   Limitations Lt hemiparesis (mild)   Patient Stated Goals work on coordination and balance, I also want a job   Currently in Pain? No/denies                    OT Treatments/Exercises (OP) - 08/07/14 0001    Shoulder Exercises: Seated    Other Seated Exercises Arm bike x30min level 10 for conditioning without rest.   Fine Motor Coordination   Fine Motor Coordination Small Pegboard   Small Pegboard Pt placing small pegs in pegboard  manipulating 5 at a time (from fingertips to/from palm) with min drops. Copying design at 100% accuracy   Other Fine Motor Exercises "braiding" string for bilateral integration and coordination with mild difficulty. Stringing "beads" without difficulty   Other Fine Motor Exercises Screwing/unscrewing nuts/bolts with Rt hand as stabalizer without difficulty using Lt hand as dominant.                      OT Long Term Goals - 07/31/14 0848    OT LONG TERM GOAL #1   Title Independent with HEP for coordination and UE strengthening on Lt (due 08/09/14)   Time 4   Period Weeks   Status Achieved   OT LONG TERM GOAL #2   Title Improve coordination as evidenced by reducing speed on 9 hole peg test to 34 sec. or less   Baseline Lt = 40.53 sec.    Time 4   Period Weeks  Status On-going   OT LONG TERM GOAL #3   Title Pt to perform 15 min. of intense physical activity for simulated yard work w/o LOB demonstrating good safety awareness   Time 4   Period Weeks   Status Achieved               Plan - 08/07/14 1323    Clinical Impression Statement Pt progressing towards remaining goal   Plan continue coordination, assess LTG #2, D/C next week (will need G-code)        Problem List Patient Active Problem List   Diagnosis Date Noted  . Unsteady gait 07/09/2014  . Abnormality of gait 05/13/2014  . Obstructive sleep apnea 05/13/2014  . Restless legs 05/13/2014  . Acute ischemic stroke 02/14/2014  . Stroke   . Heart transplanted   . CAD (coronary artery disease), native artery transplanted heart   . Chronic kidney disease, stage 3   . Hyperlipidemia     Carey Bullocks, OTR/L 08/07/2014, 1:25 PM  [facility] [address] [place], [place], [address] Phone: [phone]   Fax:  [phone]

## 2014-08-08 ENCOUNTER — Ambulatory Visit: Payer: Medicare Other | Admitting: Physical Therapy

## 2014-08-09 ENCOUNTER — Ambulatory Visit (INDEPENDENT_AMBULATORY_CARE_PROVIDER_SITE_OTHER): Payer: Medicare Other | Admitting: Neurology

## 2014-08-09 ENCOUNTER — Encounter: Payer: Self-pay | Admitting: Neurology

## 2014-08-09 VITALS — BP 127/77 | HR 82 | Wt 195.0 lb

## 2014-08-09 DIAGNOSIS — G609 Hereditary and idiopathic neuropathy, unspecified: Secondary | ICD-10-CM | POA: Diagnosis not present

## 2014-08-09 DIAGNOSIS — E119 Type 2 diabetes mellitus without complications: Secondary | ICD-10-CM | POA: Diagnosis not present

## 2014-08-09 DIAGNOSIS — I6320 Cerebral infarction due to unspecified occlusion or stenosis of unspecified precerebral arteries: Secondary | ICD-10-CM | POA: Diagnosis not present

## 2014-08-09 NOTE — Progress Notes (Signed)
[facility] [address] [place]. [place] [address] [phone]       OFFICE FOLLOW-UP NOTE  [name] Date of Birth:  08/06/1945 Medical Record Number:  [id number]   HPI: 69 year male seen today for first office follow-up visit following hospital admission for stroke on 02/14/14. He was admitted with left-sided paresthesias and weakness and presented beyond time window for intervention. MRI scan of the brain showed a small right thalamic lacunar infarct. 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 Transthoracic echo showed normal ejection fraction. Carotid ultrasound showed no significant expectoration stenosis. Vascular risk factors identified included hypertension, hyperlipidemia, sleep apnea and coronary artery disease s/p stent in Oct 2015. Patient had history of heart transplant with cardiac stent in his transplanted heart and hence was continued on aspirin and Plavix. He states his left-sided paresthesias seem to have recovered but his balance is still poor. He has had some mild residual left-sided weakness and balance difficulties from his prior strokes which seem to have gotten slightly worse following the recent stroke. He gets fatigued quite easily and his balance is terrible. He did fall once last week. Patient admits that his sleep is quite disturbed and he has a lot of involuntary leg shaking in his sleep and his wife is unable to sleep because of that. He has been diagnosed with sleep apnea and prescribed CPAP but he does not use it consistently. He has never been tried on medications for restless legs. Update 07/09/2014 : Patient is worked into the schedule urgently today as wife called saying patient has been having increasing gait and balance difficulties since 2 weeks ago. He had trouble walking down his driveway and fell on the way to the mailbox. He needed help to get up and then had trouble  getting into the bed. He has been noticing increased dragging of his left leg and his foot catching and imbalance. He was seen in the emergency room on 06/22/14 at [facility]. CT scan of the head did not reveal acute abnormality. He was sent home from there but when he saw Dr. Willey Blade last week he was not back to his baseline and hence he ordered an MRI scan of the brain which is scheduled for Sunday 07/14/14. Patient also had some lab work in his office which was apparently all right. He recently had a sleep study done last week in our office the results of which are yet elevated. He remains on aspirin and Plavix which is tolerating well with only minor bruising and no significant bleeding. He states his blood pressure has been under good control. Patient does have mild residual left-sided weakness from his prior stroke in October 2015 for which she did not fully recover. His hemoglobin A1c at that time was 5.2 and total cholesterol 185 and LDL 113 mg percent. He is on Crestor However he was able to walk a little better with outpatient therapies at that time. He denies any headache, double vision, slurred speech. Update 08/09/2014 : He returns for follow-up after last visit a month ago. He continues to have gait and balance difficulties particularly when going down the steps downhill. He fell once but fortunately did not injure himself. He is currently getting outpatient therapy which seems to be helping him. He did undergo outpatient polysomnogram on 06/30/14 which shows only evidence of mild obstructive sleep apnea for which he was not recommended CPAP. Transcranial Doppler studies done  on 07/31/14 shows elevated mean flow velocities in the right middle and cerebral and carotid arteries with history of a moderate stenosis. Elevated pulsatility indiices suggests diffuse atherosclerosis. Carotid ultrasound showed no hemodynamically significant extracranial stenosis. ROS:   14 system review of systems is positive  for fatigue,    hearing loss,  speech difficulty, weakness, gait imbalance, falls, tremors, neck pain, restless legs,   and depression and all other systems negative  PMH:  Past Medical History  Diagnosis Date  . Heart failure   . Heart transplanted   . CAD (coronary artery disease), native artery transplanted heart   . Atrial fibrillation   . Chronic kidney disease, stage 3   . Hyperlipidemia   . Insomnia   . Skin cancer   . Cellulitis     ELBOW AND TOE  . Depression   . Anxiety   . Hypertension   . Stroke     2 strokes w/in 24 hrs of cardioversion    Social History:  History   Social History  . Marital Status: Married    Spouse Name: N/A  . Number of Children: 2  . Years of Education: 12+   Occupational History  . Not on file.   Social History Main Topics  . Smoking status: Former Smoker -- 2.00 packs/day    Types: Cigarettes    Quit date: 02/14/2010  . Smokeless tobacco: Former Systems developer    Types: Chew  . Alcohol Use: No     Comment: QUIT IN 2011  . Drug Use: No  . Sexual Activity: Not on file   Other Topics Concern  . Not on file   Social History Narrative   Lives with wife, no assist device or [place] services.  Primary care for heart transplant is at [facility].        Patient is right handed.   Patient drinks 1 glass of tea daily.    Medications:   Current Outpatient Prescriptions on File Prior to Visit  Medication Sig Dispense Refill  . amLODipine (NORVASC) 5 MG tablet Take 5 mg by mouth daily.    [name] aspirin 81 MG tablet Take 162 mg by mouth every evening.    . Calcium Carbonate-Vitamin D (CALCIUM-VITAMIN D) 500-200 MG-UNIT per tablet Take 1 tablet by mouth daily.    . clonazePAM (KLONOPIN) 0.5 MG tablet Take 0.5 mg by mouth at bedtime as needed for anxiety (sleep).     . clopidogrel (PLAVIX) 75 MG tablet Take 75 mg by mouth daily.    . cycloSPORINE modified (GENGRAF) 25 MG capsule Take 75 mg by mouth 2 (two) times daily.     [name] gabapentin (NEURONTIN) 300 MG  capsule Take 1 capsule (300 mg total) by mouth at bedtime. 90 capsule 11  . hydrALAZINE (APRESOLINE) 50 MG tablet Take 50 mg by mouth 2 (two) times daily.     [name] ibuprofen (ADVIL,MOTRIN) 200 MG tablet Take 600-800 mg by mouth every 6 (six) hours as needed for mild pain.    [name] lisinopril (PRINIVIL,ZESTRIL) 5 MG tablet Take 5 mg by mouth daily.    . meclizine (ANTIVERT) 25 MG tablet Take 1 tablet (25 mg total) by mouth 3 (three) times daily as needed for dizziness. 20 tablet 0  . Multiple Vitamin (MULTI-VITAMINS) TABS Take by mouth.    . mycophenolate (CELLCEPT) 500 MG tablet Take 1,000 mg by mouth 2 (two) times daily.    . rosuvastatin (CRESTOR) 5 MG tablet Take 5 mg by mouth daily.    [name]  valACYclovir (VALTREX) 1000 MG tablet Take 1,000 mg by mouth as needed (Cold Sores).      No current facility-administered medications on file prior to visit.    Allergies:  No Known Allergies  Physical Exam General: well developed, well nourished middle aged Caucasian male, seated, in no evident distress Head: head normocephalic and atraumatic.  Neck: supple with no carotid or supraclavicular bruits Cardiovascular: regular rate and rhythm, no murmurs Musculoskeletal: no deformity Skin:  no rash/petichiae Vascular:  Normal pulses all extremities Filed Vitals:   08/09/14 0924  BP: 127/77  Pulse: 82   Neurologic Exam Mental Status: Awake and fully alert. Oriented to place and time. Recent and remote memory intact. Attention span, concentration and fund of knowledge appropriate. Mood and affect appropriate.  Cranial Nerves: Fundoscopic exam not done Pupils equal, briskly reactive to light. Extraocular movements full without nystagmus. Visual fields full to confrontation. Hearing intact. Facial sensation intact. Mild left lower face weakness., Tongue, palate moves normally and symmetrically.  Motor: Normal bulk and tone. Normal strength in all tested extremity muscles. Mild weakness of left grip. Diminished  fine finger movements on the left. Mild weakness of intrinsic hand muscles on the left. Orbits right over left. Mild weakness of left hip flexors and ankle dorsiflexors approximately. Increased tone in the left lower extremity. Sensory.: intact to touch ,pinprick .position   sensation. Diminished vibration from ankle down. Coordination: Rapid alternating movements normal in all extremities. Finger-to-nose and heel-to-shin performed accurately bilaterally. Gait and Station: Arises from chair without difficulty. Stance is normal. Gait demonstrates dragging of the left leg with slight imbalance when he turns. . Unable to heel, toe and tandem walk without difficulty.  Reflexes: 1+ and symmetric. Toes downgoing.       ASSESSMENT: 69 year male with right thalamic lacunar infarct in October 2015 secondary to small vessel disease with mild residual left hemiparesis and vascular risk factors of hypertension, hyperlipidemia, sleep apnea and coronary artery disease. Recent increased gait imbalance and falls difficulties possibly from deconitioning PLAN:  I had a long discussion with the patient and his wife regarding his gait and balance difficulties which are likely multifactorial due to combination of prior stroke related left hemiparesis as well as likely underlying peripheral neuropathy related to his immunosuppressant therapy. Check EMG nerve conduction study and neuropathy panel labs. I advised him to continue ongoing physical and occupational therapy for gait and balance as well as gait and fall prevention precautions. Continue aspirin. And Plavix for stroke prevention with strict control of hypertension with blood pressure goal below 130/90 and lipids with LDL cholesterol goal below 70 mg percent. Return for follow-up in 3 months or call earlier if necessary   Antony Contras, MD  Note: This document was prepared with digital dictation and possible smart phrase technology. Any transcriptional errors that  result from this process are unintentional

## 2014-08-09 NOTE — Patient Instructions (Signed)
I had a long discussion with the patient and his wife regarding his gait and balance difficulties which are likely multifactorial due to combination of prior stroke related left hemiparesis as well as likely underlying peripheral neuropathy related to his immunosuppressant therapy. Check EMG nerve conduction study and neuropathy panel labs. I advised him to continue ongoing physical and occupational therapy for gait and balance as well as gait and fall prevention precautions. Continue aspirin. And Plavix for stroke prevention with strict control of hypertension with blood pressure goal below 130/90 and lipids with LDL cholesterol goal below 70 mg percent. Return for follow-up in 3 months or call earlier if necessary  Fall Prevention and [place] cause injuries and can affect all age groups. It is possible to use preventive measures to significantly decrease the likelihood of falls. There are many simple measures which can make your home safer and prevent falls. OUTDOORS  Repair cracks and edges of walkways and driveways.  Remove high doorway thresholds.  Trim shrubbery on the main path into your home.  Have good outside lighting.  Clear walkways of tools, rocks, debris, and clutter.  Check that handrails are not broken and are securely fastened. Both sides of steps should have handrails.  Have leaves, snow, and ice cleared regularly.  Use sand or salt on walkways during winter months.  In the garage, clean up grease or oil spills. BATHROOM  Install night lights.  Install grab bars by the toilet and in the tub and shower.  Use non-skid mats or decals in the tub or shower.  Place a plastic non-slip stool in the shower to sit on, if needed.  Keep floors dry and clean up all water on the floor immediately.  Remove soap buildup in the tub or shower on a regular basis.  Secure bath mats with non-slip, double-sided rug tape.  Remove throw rugs and tripping hazards from the  floors. BEDROOMS  Install night lights.  Make sure a bedside light is easy to reach.  Do not use oversized bedding.  Keep a telephone by your bedside.  Have a firm chair with side arms to use for getting dressed.  Remove throw rugs and tripping hazards from the floor. KITCHEN  Keep handles on pots and pans turned toward the center of the stove. Use back burners when possible.  Clean up spills quickly and allow time for drying.  Avoid walking on wet floors.  Avoid hot utensils and knives.  Position shelves so they are not too high or low.  Place commonly used objects within easy reach.  If necessary, use a sturdy step stool with a grab bar when reaching.  Keep electrical cables out of the way.  Do not use floor polish or wax that makes floors slippery. If you must use wax, use non-skid floor wax.  Remove throw rugs and tripping hazards from the floor. STAIRWAYS  Never leave objects on stairs.  Place handrails on both sides of stairways and use them. Fix any loose handrails. Make sure handrails on both sides of the stairways are as long as the stairs.  Check carpeting to make sure it is firmly attached along stairs. Make repairs to worn or loose carpet promptly.  Avoid placing throw rugs at the top or bottom of stairways, or properly secure the rug with carpet tape to prevent slippage. Get rid of throw rugs, if possible.  Have an electrician put in a light switch at the top and bottom of the stairs. OTHER FALL  PREVENTION TIPS  Wear low-heel or rubber-soled shoes that are supportive and fit well. Wear closed toe shoes.  When using a stepladder, make sure it is fully opened and both spreaders are firmly locked. Do not climb a closed stepladder.  Add color or contrast paint or tape to grab bars and handrails in your home. Place contrasting color strips on first and last steps.  Learn and use mobility aids as needed. Install an electrical emergency response  system.  Turn on lights to avoid dark areas. Replace light bulbs that burn out immediately. Get light switches that glow.  Arrange furniture to create clear pathways. Keep furniture in the same place.  Firmly attach carpet with non-skid or double-sided tape.  Eliminate uneven floor surfaces.  Select a carpet pattern that does not visually hide the edge of steps.  Be aware of all pets. OTHER HOME SAFETY TIPS  Set the water temperature for 120 F (48.8 C).  Keep emergency numbers on or near the telephone.  Keep smoke detectors on every level of the home and near sleeping areas. Document Released: 04/02/2002 Document Revised: 10/12/2011 Document Reviewed: 07/02/2011 [facility] Patient Information 2015 [place], [place]. This information is not intended to replace advice given to you by your health care provider. Make sure you discuss any questions you have with your health care provider.

## 2014-08-12 ENCOUNTER — Telehealth: Payer: Self-pay | Admitting: *Deleted

## 2014-08-12 NOTE — Telephone Encounter (Signed)
Called and left a message for the pts wife, asking her to call back and reschedule her husband appt. Dr. Leonie Man will not be in the office 10/14/14, so when she calls back, please reschedule the appt.

## 2014-08-13 ENCOUNTER — Ambulatory Visit: Payer: Medicare Other | Admitting: Rehabilitative and Restorative Service Providers"

## 2014-08-13 ENCOUNTER — Encounter: Payer: Self-pay | Admitting: Occupational Therapy

## 2014-08-13 ENCOUNTER — Ambulatory Visit: Payer: Medicare Other | Admitting: Occupational Therapy

## 2014-08-13 ENCOUNTER — Ambulatory Visit: Payer: Medicare Other | Admitting: Physical Therapy

## 2014-08-13 DIAGNOSIS — R29898 Other symptoms and signs involving the musculoskeletal system: Secondary | ICD-10-CM | POA: Diagnosis not present

## 2014-08-13 DIAGNOSIS — R6889 Other general symptoms and signs: Secondary | ICD-10-CM

## 2014-08-13 DIAGNOSIS — R269 Unspecified abnormalities of gait and mobility: Secondary | ICD-10-CM | POA: Diagnosis not present

## 2014-08-13 DIAGNOSIS — R279 Unspecified lack of coordination: Secondary | ICD-10-CM | POA: Diagnosis not present

## 2014-08-13 DIAGNOSIS — IMO0002 Reserved for concepts with insufficient information to code with codable children: Secondary | ICD-10-CM

## 2014-08-13 DIAGNOSIS — I6980 Unspecified sequelae of other cerebrovascular disease: Secondary | ICD-10-CM | POA: Diagnosis not present

## 2014-08-13 LAB — TSH: TSH: 2.43 u[IU]/mL (ref 0.450–4.500)

## 2014-08-13 LAB — PROTEIN ELECTROPHORESIS, SERUM
A/G Ratio: 1.2 (ref 0.7–2.0)
ALBUMIN ELP: 3.8 g/dL (ref 3.2–5.6)
ALPHA 2: 0.8 g/dL (ref 0.4–1.2)
Alpha 1: 0.3 g/dL (ref 0.1–0.4)
Beta: 1.2 g/dL (ref 0.6–1.3)
GAMMA GLOBULIN: 0.9 g/dL (ref 0.5–1.6)
Globulin, Total: 3.1 g/dL (ref 2.0–4.5)
TOTAL PROTEIN: 6.9 g/dL (ref 6.0–8.5)

## 2014-08-13 LAB — HEMOGLOBIN A1C
Est. average glucose Bld gHb Est-mCnc: 117 mg/dL
Hgb A1c MFr Bld: 5.7 % — ABNORMAL HIGH (ref 4.8–5.6)

## 2014-08-13 LAB — SEDIMENTATION RATE: SED RATE: 7 mm/h (ref 0–30)

## 2014-08-13 LAB — VITAMIN B12: Vitamin B-12: 529 pg/mL (ref 211–946)

## 2014-08-13 NOTE — Therapy (Signed)
[facility] [address] [place], [place], [address] Phone: [phone]   Fax:  [phone]  Physical Therapy Treatment  Patient Details  Name: Adam Waters MRN: [id number] Date of Birth: 09-04-1945 Referring Provider:  Asencion Noble, MD  Encounter Date: 08/13/2014      PT End of Session - 08/13/14 1228    Visit Number 5   Number of Visits 9   Date for PT Re-Evaluation 09/09/14   PT Start Time 1324   PT Stop Time 4010   PT Time Calculation (min) 40 min   Activity Tolerance Patient tolerated treatment well   Behavior During Therapy [facility] for tasks assessed/performed      Past Medical History  Diagnosis Date  . Heart failure   . Heart transplanted   . CAD (coronary artery disease), native artery transplanted heart   . Atrial fibrillation   . Chronic kidney disease, stage 3   . Hyperlipidemia   . Insomnia   . Skin cancer   . Cellulitis     ELBOW AND TOE  . Depression   . Anxiety   . Hypertension   . Stroke     2 strokes w/in 24 hrs of cardioversion    Past Surgical History  Procedure Laterality Date  . Heart transplant  2012    [facility]  . Coronary angioplasty with stent placement  01/2014    in transplanted heart at [facility]  . Knee surgery Left 2008  . Eye surgery Right 2009    [facility]    There were no vitals filed for this visit.  Visit Diagnosis:  Abnormality of gait  Weakness of left lower extremity      Subjective Assessment - 08/13/14 1159    Subjective The patient reports he is doing HEP.  His falls have occurred outdoors, "I stay outdoors".   Currently in Pain? Yes   Pain Score --  mild, thinks it is worse from exercising left leg   Pain Location Hip   Pain Orientation Right   Pain Descriptors / Indicators Sore   Pain Type Acute pain   Aggravating Factors  sore after LE activities     THERAPEUTIC EXERCISE: The patient performed L foot step ups and downs to 6" step laterally with  increased speed Step-ups L LE to 6" step x 10 reps Moving L foot on/off 6" step with 4 lb weight donned and then to 12 step x 8 reps each height  NEUROMUSCULAR RE-EDUCATION: Quick, alternating foot taps to 6" step Quick marching with wide and narrow base of support Single leg stance control with cues on L hip stability  Gait: Community gait activities >800 feet on grass, curbs and level/unlevel surfaces (including hills) Gait on heels/toes with CGA for safety Gait with 4 lb weight on ankle with frequent L foot drag requiring min A to recover x 230 ft Backwards walking                                 PT Long Term Goals - 07/15/14 0902    PT LONG TERM GOAL #1   Title Pt will be independent with HEP for improved strength, balance, gait. (Target 08/10/14)   Time 4   Period Weeks   Status On-going   PT LONG TERM GOAL #2   Title Pt will improve Timed Up and Go score to less than or equal to 13.5 seconds for decreased  fall risk.   Time 4   Period Weeks   Status On-going   PT LONG TERM GOAL #3   Title Pt will improve Dynamic Gait Index score to at least 19/24 for decreased fall risk.   Time 4   Period Weeks   Status On-going   PT LONG TERM GOAL #4   Title Pt will verbalize understanding of fall prevention within the home environment.   Time 4   Period Weeks   Status On-going   PT LONG TERM GOAL #5   Title Pt will demonstrate understanding of use of cane with gait, as evidenced by gait >500 ft on indoor/outdoor surfaces with cane, modified independently with no LOB.   Time 4   Period Weeks   Status New               Plan - 08/13/14 1409    Clinical Impression Statement The patient ambulated independently today on level and unlevel community surfaces.  With a 4 lb weight donned, it replicated the L foot scuff he gets when he is fatigued and reports that is the situation that makes him fall.  PT to continue to work on L LE control.  Foot scuffing  also occurs during divided attention tasks of reading a list while walking.   PT Next Visit Plan possibly try ankle weight on LLE for improved proprioception/foot placement with gait; compliant surface balance training   Consulted and Agree with Plan of Care Patient        Problem List Patient Active Problem List   Diagnosis Date Noted  . Unsteady gait 07/09/2014  . Abnormality of gait 05/13/2014  . Obstructive sleep apnea 05/13/2014  . Restless legs 05/13/2014  . Acute ischemic stroke 02/14/2014  . Stroke   . Heart transplanted   . CAD (coronary artery disease), native artery transplanted heart   . Chronic kidney disease, stage 3   . Hyperlipidemia     Jodee Wagenaar, PT 08/13/2014, 2:15 PM  [facility] [address] [place], [place], [address] Phone: [phone]   Fax:  [phone]

## 2014-08-13 NOTE — Therapy (Signed)
[facility] [address] [place], [place], [address] Phone: [phone]   Fax:  [phone]  Occupational Therapy Treatment  Patient Details  Name: Adam Waters MRN: [id number] Date of Birth: 08-08-1945 Referring Provider:  Asencion Noble, MD  Encounter Date: 08/13/2014      OT End of Session - 08/13/14 1122    Visit Number 7  G   Number of Visits 9   Date for OT Re-Evaluation 08/16/14   Authorization Type MCR   Authorization Time Period 60 DAYS - week 4/4   OT Start Time 1103   OT Stop Time 1145   OT Time Calculation (min) 42 min   Activity Tolerance Patient tolerated treatment well      Past Medical History  Diagnosis Date  . Heart failure   . Heart transplanted   . CAD (coronary artery disease), native artery transplanted heart   . Atrial fibrillation   . Chronic kidney disease, stage 3   . Hyperlipidemia   . Insomnia   . Skin cancer   . Cellulitis     ELBOW AND TOE  . Depression   . Anxiety   . Hypertension   . Stroke     2 strokes w/in 24 hrs of cardioversion    Past Surgical History  Procedure Laterality Date  . Heart transplant  2012    [facility]  . Coronary angioplasty with stent placement  01/2014    in transplanted heart at [facility]  . Knee surgery Left 2008  . Eye surgery Right 2009    [facility]    There were no vitals filed for this visit.  Visit Diagnosis:  Lack of coordination due to stroke  Decreased functional activity tolerance  Weakness of left upper extremity      Subjective Assessment - 08/13/14 1117    Subjective  "I'm a little sore from chopping wood yesterday." "I'm hard-headed, I will finish it today."   Pertinent History multiple CVA's, Rt rotator cuff surgery (30 years ago), heart transplant 2012   Limitations Lt hemiparesis (mild)   Currently in Pain? Yes   Pain Score 1    Pain Location Arm   Pain Orientation Left   Pain Descriptors / Indicators Sore   Pain Onset Today    Aggravating Factors  sore after activity   Pain Relieving Factors nothing                      OT Treatments/Exercises (OP) - 08/13/14 0001    Fine Motor Coordination   Fine Motor Coordination O'Connor pegs   O'Connor pegs with min difficulty/mod incr time with tweezers   Grooved pegs min difficulty with L hand for incr coordination   Functional Reaching Activities   High Level Functional reaching to place small pegs in vertical pegboard with L hand with 2lb wt on L wrist without rest for incr strength, coordination, and activity tolerance.                     OT Long Term Goals - 07/31/14 0848    OT LONG TERM GOAL #1   Title Independent with HEP for coordination and UE strengthening on Lt (due 08/09/14)   Time 4   Period Weeks   Status Achieved   OT LONG TERM GOAL #2   Title Improve coordination as evidenced by reducing speed on 9 hole peg test to 34 sec. or less   Baseline Lt = 40.53 sec.  Time 4   Period Weeks   Status On-going   OT LONG TERM GOAL #3   Title Pt to perform 15 min. of intense physical activity for simulated yard work w/o LOB demonstrating good safety awareness   Time 4   Period Weeks   Status Achieved               Plan - 08/13/14 1217    Clinical Impression Statement Pt continues to progress towards remaining goal.  Cautioned pt against chopping wood, particularly when fatigued, but pt reports that he will continue.   Plan assess LTG#2, d/c next session, g-code needed   Consulted and Agree with Plan of Care Patient        Problem List Patient Active Problem List   Diagnosis Date Noted  . Unsteady gait 07/09/2014  . Abnormality of gait 05/13/2014  . Obstructive sleep apnea 05/13/2014  . Restless legs 05/13/2014  . Acute ischemic stroke 02/14/2014  . Stroke   . Heart transplanted   . CAD (coronary artery disease), native artery transplanted heart   . Chronic kidney disease, stage 3   . Hyperlipidemia      [facility] 08/13/2014, 12:22 PM  [facility] [address] [place], [place], [address] Phone: [phone]   Fax:  [place], OTR/L 08/13/2014 12:22 PM

## 2014-08-15 ENCOUNTER — Ambulatory Visit: Payer: Medicare Other | Admitting: Physical Therapy

## 2014-08-15 ENCOUNTER — Ambulatory Visit: Payer: Medicare Other | Admitting: Occupational Therapy

## 2014-08-15 DIAGNOSIS — R29898 Other symptoms and signs involving the musculoskeletal system: Secondary | ICD-10-CM

## 2014-08-15 DIAGNOSIS — R6889 Other general symptoms and signs: Secondary | ICD-10-CM | POA: Diagnosis not present

## 2014-08-15 DIAGNOSIS — IMO0002 Reserved for concepts with insufficient information to code with codable children: Secondary | ICD-10-CM

## 2014-08-15 DIAGNOSIS — R279 Unspecified lack of coordination: Secondary | ICD-10-CM | POA: Diagnosis not present

## 2014-08-15 DIAGNOSIS — R269 Unspecified abnormalities of gait and mobility: Secondary | ICD-10-CM | POA: Diagnosis not present

## 2014-08-15 DIAGNOSIS — I6980 Unspecified sequelae of other cerebrovascular disease: Secondary | ICD-10-CM | POA: Diagnosis not present

## 2014-08-15 NOTE — Therapy (Signed)
[facility] [address] [place], [place], [address] Phone: [phone]   Fax:  [phone]  Occupational Therapy Treatment  Patient Details  Name: Adam Waters MRN: [id number] Date of Birth: 01-Jul-1945 Referring Provider:  Asencion Noble, MD  Encounter Date: 08/15/2014      OT End of Session - 08/15/14 0944    Visit Number 8  G   Number of Visits 9   Date for OT Re-Evaluation 08/16/14   Authorization Type MCR   Authorization Time Period 60 DAYS - week 4/4   OT Start Time 0933   OT Stop Time 1015   OT Time Calculation (min) 42 min   Activity Tolerance Patient tolerated treatment well      Past Medical History  Diagnosis Date  . Heart failure   . Heart transplanted   . CAD (coronary artery disease), native artery transplanted heart   . Atrial fibrillation   . Chronic kidney disease, stage 3   . Hyperlipidemia   . Insomnia   . Skin cancer   . Cellulitis     ELBOW AND TOE  . Depression   . Anxiety   . Hypertension   . Stroke     2 strokes w/in 24 hrs of cardioversion    Past Surgical History  Procedure Laterality Date  . Heart transplant  2012    [facility]  . Coronary angioplasty with stent placement  01/2014    in transplanted heart at [facility]  . Knee surgery Left 2008  . Eye surgery Right 2009    [facility]    There were no vitals filed for this visit.  Visit Diagnosis:  No diagnosis found.      Subjective Assessment - 08/15/14 0951    Subjective  "I locked my keys in the car this morning"   Pertinent History multiple CVA's, Rt rotator cuff surgery (30 years ago), heart transplant 2012   Limitations Lt hemiparesis (mild)   Currently in Pain? No/denies                      OT Treatments/Exercises (OP) - 08/15/14 0001    ADLs   ADL Comments Checked goals and discussed progress/discharge.  Completed FOTO by assisting pt with reading as pt forgot his glasses.  Score of  60% on Stroke IS  Hand Function.  (decline may be due to incr awareness after therapy)   Fine Motor Coordination   Fine Motor Coordination In hand manipuation training;Purdue Pegboard   In Hand Manipulation Training rotating relaxation balls in L hand with min-mod difficulty   Small Pegboard Placing small pegs in pegboard with each finger/thumb for incr coordination with min difficulty and incr time.  Copying degin at 100% accuracy.  Removing by manipulating 5 at a time.   Purdue Pegboard with min difficulty for increased coordination                     OT Long Term Goals - 08/15/14 0938    OT LONG TERM GOAL #1   Title Independent with HEP for coordination and UE strengthening on Lt (due 08/09/14)   Time 4   Period Weeks   Status Achieved   OT LONG TERM GOAL #2   Title Improve coordination as evidenced by reducing speed on 9 hole peg test to 34 sec. or less   Baseline Lt = 40.53 sec.    Time 4   Period Weeks   Status Not  Met  2014/09/06:  40.53sec, 37.41sec    OT LONG TERM GOAL #3   Title Pt to perform 15 min. of intense physical activity for simulated yard work w/o LOB demonstrating good safety awareness   Time 4   Period Weeks   Status Achieved               Plan - 09/06/14 0940    Clinical Impression Statement Pt met 2/3 LTGs, but is inconsistent with fine motor coordination.     Plan d/c OT, pt to continue HEP, g-code completed   Consulted and Agree with Plan of Care Patient          G-Codes - 09-06-14 0941    Functional Assessment Tool Used 9-hole peg test:  L-40.53, 37.41sec   Functional Limitation Carrying, moving and handling objects   Carrying, Moving and Handling Objects Goal Status (S9324) At least 1 percent but less than 20 percent impaired, limited or restricted   Carrying, Moving and Handling Objects Discharge Status [phone]) At least 20 percent but less than 40 percent impaired, limited or restricted      OCCUPATIONAL THERAPY DISCHARGE SUMMARY  Remaining  deficits: Mild decreased coordination   Education / Equipment: HEP, safety concerns.  Pt verbalized understanding of education provided, but reports that he will continue to do activities such as chasing cows, chopping wood, climbing on tractor.  Plan: Patient agrees to discharge.  Patient goals were partially met. Patient is being discharged due to                                                    reaching maximal rehab potential. ?????       Problem List Patient Active Problem List   Diagnosis Date Noted  . Unsteady gait 07/09/2014  . Abnormality of gait 05/13/2014  . Obstructive sleep apnea 05/13/2014  . Restless legs 05/13/2014  . Acute ischemic stroke 02/14/2014  . Stroke   . Heart transplanted   . CAD (coronary artery disease), native artery transplanted heart   . Chronic kidney disease, stage 3   . Hyperlipidemia     [facility] Sep 06, 2014, 11:49 AM  [place] [address] [place], [place], [address] Phone: [phone]   Fax:  [place], OTR/L 06-Sep-2014 11:49 AM

## 2014-08-16 NOTE — Therapy (Signed)
[facility] [address] [place], [place], [address] Phone: [phone]   Fax:  [phone]  Physical Therapy Treatment  Patient Details  Name: Adam Waters MRN: [id number] Date of Birth: 02-08-1946 Referring Provider:  Asencion Noble, MD  Encounter Date: 08/15/2014      PT End of Session - 08/16/14 1335    Visit Number 6   Number of Visits 14  per recert 01/10/90 visit   Date for PT Re-Evaluation 10/15/14   PT Start Time 1018   PT Stop Time 1058   PT Time Calculation (min) 40 min   Activity Tolerance Patient tolerated treatment well   Behavior During Therapy [facility] for tasks assessed/performed      Past Medical History  Diagnosis Date  . Heart failure   . Heart transplanted   . CAD (coronary artery disease), native artery transplanted heart   . Atrial fibrillation   . Chronic kidney disease, stage 3   . Hyperlipidemia   . Insomnia   . Skin cancer   . Cellulitis     ELBOW AND TOE  . Depression   . Anxiety   . Hypertension   . Stroke     2 strokes w/in 24 hrs of cardioversion    Past Surgical History  Procedure Laterality Date  . Heart transplant  2012    [facility]  . Coronary angioplasty with stent placement  01/2014    in transplanted heart at [facility]  . Knee surgery Left 2008  . Eye surgery Right 2009    [facility]    There were no vitals filed for this visit.  Visit Diagnosis:  Weakness of left lower extremity  Lack of coordination due to stroke  Abnormality of gait      Subjective Assessment - 08/15/14 1021    Subjective Today's my last visit-still stumble some, but no falls.  Pt reports he likes coming to therapy and is still concerned about his balance.   Currently in Pain? No/denies            [facility] PT Assessment - 08/15/14 1025    Dynamic Gait Index   Level Surface Mild Impairment   Change in Gait Speed Normal   Gait with Horizontal Head Turns Normal   Gait with Vertical Head Turns  Normal   Gait and Pivot Turn Normal   Step Over Obstacle Normal   Step Around Obstacles Normal   Steps Mild Impairment   Total Score 22   Timed Up and Go Test   Normal TUG (seconds) 10.93   Cognitive TUG (seconds) 10.64  1 episode of foot drag during turn; 2nd trial 12.63 sec   Functional Gait  Assessment   Gait assessed  Yes   Gait Level Surface Walks 20 ft in less than 7 sec but greater than 5.5 sec, uses assistive device, slower speed, mild gait deviations, or deviates 6-10 in outside of the 12 in walkway width.   Change in Gait Speed Able to smoothly change walking speed without loss of balance or gait deviation. Deviate no more than 6 in outside of the 12 in walkway width.   Gait with Horizontal Head Turns Performs head turns smoothly with no change in gait. Deviates no more than 6 in outside 12 in walkway width   Gait with Vertical Head Turns Performs head turns with no change in gait. Deviates no more than 6 in outside 12 in walkway width.   Gait and Pivot Turn Pivot turns safely  within 3 sec and stops quickly with no loss of balance.   Step Over Obstacle Is able to step over one shoe box (4.5 in total height) without changing gait speed. No evidence of imbalance.   Gait with Narrow Base of Support Ambulates 7-9 steps.   Gait with Eyes Closed Walks 20 ft, uses assistive device, slower speed, mild gait deviations, deviates 6-10 in outside 12 in walkway width. Ambulates 20 ft in less than 9 sec but greater than 7 sec.   Ambulating Backwards Walks 20 ft, no assistive devices, good speed, no evidence for imbalance, normal gait  9.58 sec in 20 ft   Steps Alternating feet, must use rail.   Total Score 25      Reviewed HEP-pt requires cues for proper technique and to increase awareness of LLE for improved clearance.  Gait activities indoor and outdoor surfaces, with PT providing cues for increased L foot clearance.  During gait activities on outdoor surfaces with no conversation and full  attention on walking, pt has no episodes of L foot scuffing the ground.  With conversation activities or distractions, pt has multiple episodes of L foot scuffing the ground despite cues.                       PT Education - 08/15/14 1049    Education provided Yes   Education Details fall prevention; plan of care; discussion regarding continuing PT vs. discharge; ultimately decided to continue PT to further address dynamic balance, gait, LLE strength, improved dual task iwth gait             PT Long Term Goals - 08/16/14 0748    PT LONG TERM GOAL #1   Title Pt will be independent with HEP for improved strength, balance, gait. (Continued-target 09/14/14)   Baseline needs cues   Status On-going   PT LONG TERM GOAL #2   Title Pt will improve Timed Up and Go score to less than or equal to 13.5 seconds for decreased fall risk.   Baseline 08/15/14-10.93 sec   Status Achieved   PT LONG TERM GOAL #3   Title Pt will improve Dynamic Gait Index score to at least 19/24 for decreased fall risk.   Baseline DGI 22/24-4/21/16   Status Achieved   PT LONG TERM GOAL #4   Title Pt will verbalize understanding of fall prevention within the home environment.   Status Achieved   PT LONG TERM GOAL #5   Title Pt will demonstrate understanding of use of cane with gait, as evidenced by gait >500 ft on indoor/outdoor surfaces with cane, modified independently with no LOB.   Baseline Pt ambulates >500 ft, but declines to use cane; has decreased LLE foot clearance.   Status Partially Met   Additional Long Term Goals   Additional Long Term Goals Yes   PT LONG TERM GOAL #6   Title Pt will ambulate at least 1000 ft, indoor and outdoor surfaces, no device and no loss of balance, independently for improved mobility and decreased fall risk.  Target 09/14/14   PT LONG TERM GOAL #7   Title Pt will verbalize plans for appropriate ongoing community fitness activities upon D/C from PT. Target 09/14/14    Time 4   Period Weeks   Status New               Plan - 08/16/14 1337    Clinical Impression Statement Pt has met LTG #2, 3, 4.  Pt has not met #1 and has partially met #5.  Pt continues to have difficulty with divided attention or dual tasking, with decreased L foot clearance noted.  Pt has made progress with gait, but may benefit from further skilled PT to further address strength, balance and coordination of LLE for imrpoved gait and decreased fall risk.   Pt will benefit from skilled therapeutic intervention in order to improve on the following deficits Abnormal gait;Decreased coordination;Decreased balance;Decreased mobility;Decreased knowledge of use of DME;Decreased strength;Difficulty walking   Rehab Potential Good   PT Frequency 2x / week   PT Duration 4 weeks  per recert 05/23/09   PT Treatment/Interventions ADLs/Self Care Home Management;Functional mobility training;Gait training;DME Instruction;Therapeutic activities;Therapeutic exercise;Balance training;Neuromuscular re-education;Patient/family education   PT Next Visit Plan strengthening and weightbearing exercise on LLE; dynamic balance on compliant surfaces and agility type exercises   Consulted and Agree with Plan of Care Patient        Problem List Patient Active Problem List   Diagnosis Date Noted  . Unsteady gait 07/09/2014  . Abnormality of gait 05/13/2014  . Obstructive sleep apnea 05/13/2014  . Restless legs 05/13/2014  . Acute ischemic stroke 02/14/2014  . Stroke   . Heart transplanted   . CAD (coronary artery disease), native artery transplanted heart   . Chronic kidney disease, stage 3   . Hyperlipidemia     Madox Corkins W. 08/16/2014, 1:42 PM  Mady Haagensen, PT 08/16/2014 1:44 PM Phone: [phone] Fax: [place] [address] [place], [place], [address] Phone: [phone]   Fax:  [phone]

## 2014-08-23 ENCOUNTER — Encounter: Payer: Medicare Other | Admitting: Neurology

## 2014-08-28 ENCOUNTER — Ambulatory Visit: Payer: Medicare Other

## 2014-08-30 ENCOUNTER — Ambulatory Visit: Payer: Medicare Other

## 2014-09-03 ENCOUNTER — Ambulatory Visit: Payer: Medicare Other | Admitting: Physical Therapy

## 2014-09-06 ENCOUNTER — Ambulatory Visit: Payer: Medicare Other

## 2014-09-11 ENCOUNTER — Ambulatory Visit: Payer: Medicare Other | Admitting: Physical Therapy

## 2014-09-13 ENCOUNTER — Ambulatory Visit: Payer: Medicare Other

## 2014-09-17 ENCOUNTER — Ambulatory Visit: Payer: Medicare Other | Admitting: Physical Therapy

## 2014-09-17 ENCOUNTER — Telehealth: Payer: Self-pay | Admitting: *Deleted

## 2014-09-17 NOTE — Telephone Encounter (Signed)
Left vm reminding pt of FU in July. June FU cancelled due to dr in hospital that month, but pt already had July FU which is 3 month after last FU.

## 2014-09-18 DIAGNOSIS — C44212 Basal cell carcinoma of skin of right ear and external auricular canal: Secondary | ICD-10-CM | POA: Diagnosis not present

## 2014-09-20 ENCOUNTER — Ambulatory Visit: Payer: Medicare Other

## 2014-09-30 DIAGNOSIS — I10 Essential (primary) hypertension: Secondary | ICD-10-CM | POA: Diagnosis not present

## 2014-09-30 DIAGNOSIS — I639 Cerebral infarction, unspecified: Secondary | ICD-10-CM | POA: Diagnosis not present

## 2014-10-09 DIAGNOSIS — C4442 Squamous cell carcinoma of skin of scalp and neck: Secondary | ICD-10-CM | POA: Diagnosis not present

## 2014-10-09 DIAGNOSIS — L570 Actinic keratosis: Secondary | ICD-10-CM | POA: Diagnosis not present

## 2014-10-14 ENCOUNTER — Ambulatory Visit: Payer: Medicare Other | Admitting: Neurology

## 2014-10-15 DIAGNOSIS — Z79899 Other long term (current) drug therapy: Secondary | ICD-10-CM | POA: Diagnosis not present

## 2014-10-15 DIAGNOSIS — E785 Hyperlipidemia, unspecified: Secondary | ICD-10-CM | POA: Diagnosis not present

## 2014-10-15 DIAGNOSIS — N189 Chronic kidney disease, unspecified: Secondary | ICD-10-CM | POA: Diagnosis not present

## 2014-10-15 DIAGNOSIS — Z941 Heart transplant status: Secondary | ICD-10-CM | POA: Diagnosis not present

## 2014-10-15 DIAGNOSIS — I129 Hypertensive chronic kidney disease with stage 1 through stage 4 chronic kidney disease, or unspecified chronic kidney disease: Secondary | ICD-10-CM | POA: Diagnosis not present

## 2014-10-15 DIAGNOSIS — Z9861 Coronary angioplasty status: Secondary | ICD-10-CM | POA: Diagnosis not present

## 2014-10-15 DIAGNOSIS — Z48298 Encounter for aftercare following other organ transplant: Secondary | ICD-10-CM | POA: Diagnosis not present

## 2014-10-15 DIAGNOSIS — D899 Disorder involving the immune mechanism, unspecified: Secondary | ICD-10-CM | POA: Diagnosis not present

## 2014-10-15 DIAGNOSIS — Z8679 Personal history of other diseases of the circulatory system: Secondary | ICD-10-CM | POA: Diagnosis not present

## 2014-10-15 DIAGNOSIS — N183 Chronic kidney disease, stage 3 (moderate): Secondary | ICD-10-CM | POA: Diagnosis not present

## 2014-10-15 DIAGNOSIS — M109 Gout, unspecified: Secondary | ICD-10-CM | POA: Diagnosis not present

## 2014-10-15 DIAGNOSIS — I69354 Hemiplegia and hemiparesis following cerebral infarction affecting left non-dominant side: Secondary | ICD-10-CM | POA: Diagnosis not present

## 2014-10-15 DIAGNOSIS — I517 Cardiomegaly: Secondary | ICD-10-CM | POA: Diagnosis not present

## 2014-10-15 DIAGNOSIS — Z4821 Encounter for aftercare following heart transplant: Secondary | ICD-10-CM | POA: Diagnosis not present

## 2014-10-15 DIAGNOSIS — Z7902 Long term (current) use of antithrombotics/antiplatelets: Secondary | ICD-10-CM | POA: Diagnosis not present

## 2014-11-14 ENCOUNTER — Encounter: Payer: Self-pay | Admitting: Neurology

## 2014-11-14 ENCOUNTER — Ambulatory Visit (INDEPENDENT_AMBULATORY_CARE_PROVIDER_SITE_OTHER): Payer: Medicare Other | Admitting: Neurology

## 2014-11-14 VITALS — BP 96/59 | HR 86 | Ht 70.0 in | Wt 188.4 lb

## 2014-11-14 DIAGNOSIS — G620 Drug-induced polyneuropathy: Secondary | ICD-10-CM | POA: Diagnosis not present

## 2014-11-14 DIAGNOSIS — I6320 Cerebral infarction due to unspecified occlusion or stenosis of unspecified precerebral arteries: Secondary | ICD-10-CM | POA: Diagnosis not present

## 2014-11-14 DIAGNOSIS — T451X5A Adverse effect of antineoplastic and immunosuppressive drugs, initial encounter: Principal | ICD-10-CM

## 2014-11-14 NOTE — Patient Instructions (Addendum)
I had a long d/w patient and his wife about his remote stroke, risk for recurrent stroke/TIAs, personally independently reviewed imaging studies and stroke evaluation results and answered questions.Continue aspirin 81 mg orally every day and clopidogrel 75 mg orally every day given history of cardiac stent for secondary stroke prevention and maintain strict control of hypertension with blood pressure goal below 130/90, diabetes with hemoglobin A1c goal below 6.5% and lipids with LDL cholesterol goal below 100 mg/dL. I also advised the patient to eat a healthy diet with plenty of whole grains, cereals, fruits and vegetables, exercise regularly and maintain ideal body weight. Check EMG nerve conduction study to evaluate degree of underlying neuropathy. I have advised patient to get up slowly and avoid certain movements and falls. Greater than 50% of this 25 minute visit was spent on counseling and coordination of care. Followup in the future with me in 6 months or call earlier if necessary.

## 2014-11-14 NOTE — Progress Notes (Signed)
[facility] [address] [place]. [place] [address] [phone]       OFFICE FOLLOW-UP NOTE  [name] Date of Birth:  07/30/1945 Medical Record Number:  [id number]   HPI: 69 year male seen today for first office follow-up visit following hospital admission for stroke on 02/14/14. He was admitted with left-sided paresthesias and weakness and presented beyond time window for intervention. MRI scan of the brain showed a small right thalamic lacunar infarct. 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 Transthoracic echo showed normal ejection fraction. Carotid ultrasound showed no significant expectoration stenosis. Vascular risk factors identified included hypertension, hyperlipidemia, sleep apnea and coronary artery disease s/p stent in Oct 2015. Patient had history of heart transplant with cardiac stent in his transplanted heart and hence was continued on aspirin and Plavix. He states his left-sided paresthesias seem to have recovered but his balance is still poor. He has had some mild residual left-sided weakness and balance difficulties from his prior strokes which seem to have gotten slightly worse following the recent stroke. He gets fatigued quite easily and his balance is terrible. He did fall once last week. Patient admits that his sleep is quite disturbed and he has a lot of involuntary leg shaking in his sleep and his wife is unable to sleep because of that. He has been diagnosed with sleep apnea and prescribed CPAP but he does not use it consistently. He has never been tried on medications for restless legs. Update 07/09/2014 : Patient is worked into the schedule urgently today as wife called saying patient has been having increasing gait and balance difficulties since 2 weeks ago. He had trouble walking down his driveway and fell on the way to the mailbox. He needed help to get up and then had trouble  getting into the bed. He has been noticing increased dragging of his left leg and his foot catching and imbalance. He was seen in the emergency room on 06/22/14 at [facility]. CT scan of the head did not reveal acute abnormality. He was sent home from there but when he saw Dr. Willey Blade last week he was not back to his baseline and hence he ordered an MRI scan of the brain which is scheduled for Sunday 07/14/14. Patient also had some lab work in his office which was apparently all right. He recently had a sleep study done last week in our office the results of which are yet elevated. He remains on aspirin and Plavix which is tolerating well with only minor bruising and no significant bleeding. He states his blood pressure has been under good control. Patient does have mild residual left-sided weakness from his prior stroke in October 2015 for which she did not fully recover. His hemoglobin A1c at that time was 5.2 and total cholesterol 185 and LDL 113 mg percent. He is on Crestor However he was able to walk a little better with outpatient therapies at that time. He denies any headache, double vision, slurred speech. Update 08/09/2014 : He returns for follow-up after last visit a month ago. He continues to have gait and balance difficulties particularly when going down the steps downhill. He fell once but fortunately did not injure himself. He is currently getting outpatient therapy which seems to be helping him. He did undergo outpatient polysomnogram on 06/30/14 which shows only evidence of mild obstructive sleep apnea for which he was not recommended CPAP. Transcranial Doppler studies done  on 07/31/14 shows elevated mean flow velocities in the right middle and cerebral and carotid arteries with history of a moderate stenosis. Elevated pulsatility indiices suggests diffuse atherosclerosis. Carotid ultrasound showed no hemodynamically significant extracranial stenosis. Update 11/14/2014 : He returns for follow-up  after last visit 3 months ago. He continues to have gait and balance difficulties. He does also complain of increasing fatigue and difficulty with walking. He however has had no major falls or injuries. He denies tingling numbness in his feet. He did get outpatient physical occupational therapy which seems to have helped. He has an upcoming visit at [facility] for discussion of possible cardiac cath and stent. He has remained on cyclosporine since his heart concentration in 2012 and has concerns he may have developed peripheral neuropathy from it ROS:   14 system review of systems is positive for fatigue,    hearing loss,  swollen abdomen, cold intolerance, diarrhea, insomnia, frequent waking, frequency of urination, speech difficulty, weakness, dizziness, walking difficulty, neck pain and stiffness and all other systems negative  PMH:  Past Medical History  Diagnosis Date  . Heart failure   . Heart transplanted   . CAD (coronary artery disease), native artery transplanted heart   . Atrial fibrillation   . Chronic kidney disease, stage 3   . Hyperlipidemia   . Insomnia   . Skin cancer   . Cellulitis     ELBOW AND TOE  . Depression   . Anxiety   . Hypertension   . Stroke     2 strokes w/in 24 hrs of cardioversion    Social History:  History   Social History  . Marital Status: Married    Spouse Name: N/A  . Number of Children: 2  . Years of Education: 12+   Occupational History  . Not on file.   Social History Main Topics  . Smoking status: Former Smoker -- 2.00 packs/day    Types: Cigarettes    Quit date: 02/14/2010  . Smokeless tobacco: Former Systems developer    Types: Chew  . Alcohol Use: No     Comment: QUIT IN 2011  . Drug Use: No  . Sexual Activity: Not on file   Other Topics Concern  . Not on file   Social History Narrative   Lives with wife, no assist device or [place] services.  Primary care for heart transplant is at [facility].        Patient is right handed.   Patient drinks 1  glass of tea daily.    Medications:   Current Outpatient Prescriptions on File Prior to Visit  Medication Sig Dispense Refill  . amLODipine (NORVASC) 5 MG tablet Take 5 mg by mouth daily.    [name] aspirin 81 MG tablet Take 162 mg by mouth every evening.    . Calcium Carbonate-Vitamin D (CALCIUM-VITAMIN D) 500-200 MG-UNIT per tablet Take 1 tablet by mouth daily.    . clonazePAM (KLONOPIN) 0.5 MG tablet Take 0.5 mg by mouth at bedtime as needed for anxiety (sleep).     . clopidogrel (PLAVIX) 75 MG tablet Take 75 mg by mouth daily.    . cycloSPORINE modified (GENGRAF) 25 MG capsule Take 75 mg by mouth 2 (two) times daily.     . hydrALAZINE (APRESOLINE) 50 MG tablet Take 50 mg by mouth 2 (two) times daily.     [name] ibuprofen (ADVIL,MOTRIN) 200 MG tablet Take 600-800 mg by mouth every 6 (six) hours as needed for mild pain.    [name]  lisinopril (PRINIVIL,ZESTRIL) 5 MG tablet Take 5 mg by mouth daily.    . Multiple Vitamin (MULTI-VITAMINS) TABS Take by mouth.    . mycophenolate (CELLCEPT) 500 MG tablet Take 1,000 mg by mouth 2 (two) times daily.    . rosuvastatin (CRESTOR) 5 MG tablet Take 5 mg by mouth daily.    [name] gabapentin (NEURONTIN) 300 MG capsule Take 1 capsule (300 mg total) by mouth at bedtime. (Patient not taking: Reported on 11/14/2014) 90 capsule 11  . meclizine (ANTIVERT) 25 MG tablet Take 1 tablet (25 mg total) by mouth 3 (three) times daily as needed for dizziness. (Patient not taking: Reported on 11/14/2014) 20 tablet 0  . valACYclovir (VALTREX) 1000 MG tablet Take 1,000 mg by mouth as needed (Cold Sores).      No current facility-administered medications on file prior to visit.    Allergies:  No Known Allergies  Physical Exam General: well developed, well nourished middle aged Caucasian male, seated, in no evident distress Head: head normocephalic and atraumatic.  Neck: supple with no carotid or supraclavicular bruits Cardiovascular: regular rate and rhythm, no murmurs Musculoskeletal: no  deformity Skin:  no rash/petichiae Vascular:  Normal pulses all extremities Filed Vitals:   11/14/14 1323  BP: 96/59  Pulse: 86   Neurologic Exam Mental Status: Awake and fully alert. Oriented to place and time. Recent and remote memory intact. Attention span, concentration and fund of knowledge appropriate. Mood and affect appropriate.  Cranial Nerves: Fundoscopic exam not done Pupils equal, briskly reactive to light. Extraocular movements full without nystagmus. Visual fields full to confrontation. Hearing intact. Facial sensation intact. Mild left lower face weakness., Tongue, palate moves normally and symmetrically.  Motor: Normal bulk and tone. Normal strength in all tested extremity muscles. Mild weakness of left grip. Diminished fine finger movements on the left. Mild weakness of intrinsic hand muscles on the left. Orbits right over left. Mild weakness of left hip flexors and ankle dorsiflexors approximately. Increased tone in the left lower extremity. Sensory.: intact to touch ,pinprick .position   sensation. Diminished vibration from ankle down. Coordination: Rapid alternating movements normal in all extremities. Finger-to-nose and heel-to-shin performed accurately bilaterally. Gait and Station: Arises from chair without difficulty. Stance is normal. Gait demonstrates dragging of the left leg with slight imbalance when he turns. . Unable to heel, toe and tandem walk without difficulty.  Reflexes: 1+ and symmetric. Toes downgoing.       ASSESSMENT: 69 year male with right thalamic lacunar infarct in October 2015 secondary to small vessel disease with mild residual left hemiparesis and vascular risk factors of hypertension, hyperlipidemia, sleep apnea and coronary artery disease. Continuing gait imbalance and falls difficulties possibly from combination of damage from prior stroke and underlying peripheral neuropathy PLAN:  I had a long d/w patient and his wife about his remote stroke,  risk for recurrent stroke/TIAs, personally independently reviewed imaging studies and stroke evaluation results and answered questions.Continue aspirin 81 mg orally every day and clopidogrel 75 mg orally every day given history of cardiac stent for secondary stroke prevention and maintain strict control of hypertension with blood pressure goal below 130/90, diabetes with hemoglobin A1c goal below 6.5% and lipids with LDL cholesterol goal below 100 mg/dL. I also advised the patient to eat a healthy diet with plenty of whole grains, cereals, fruits and vegetables, exercise regularly and maintain ideal body weight. Check EMG nerve conduction study to evaluate degree of underlying neuropathy. I have advised patient to get up slowly and avoid  certain movements and falls. Greater than 50% of this 25 minute visit was spent on counseling and coordination of care. Followup in the future with me in 6 months or call earlier if necessary.   Antony Contras, MD  Note: This document was prepared with digital dictation and possible smart phrase technology. Any transcriptional errors that result from this process are unintentional

## 2014-12-02 ENCOUNTER — Ambulatory Visit (INDEPENDENT_AMBULATORY_CARE_PROVIDER_SITE_OTHER): Payer: Medicare Other | Admitting: Neurology

## 2014-12-02 ENCOUNTER — Ambulatory Visit (INDEPENDENT_AMBULATORY_CARE_PROVIDER_SITE_OTHER): Payer: Self-pay | Admitting: Neurology

## 2014-12-02 ENCOUNTER — Encounter: Payer: Self-pay | Admitting: Neurology

## 2014-12-02 DIAGNOSIS — T451X5A Adverse effect of antineoplastic and immunosuppressive drugs, initial encounter: Principal | ICD-10-CM

## 2014-12-02 DIAGNOSIS — G620 Drug-induced polyneuropathy: Secondary | ICD-10-CM | POA: Diagnosis not present

## 2014-12-02 DIAGNOSIS — R269 Unspecified abnormalities of gait and mobility: Secondary | ICD-10-CM

## 2014-12-02 NOTE — Procedures (Signed)
     HISTORY:  Adam Waters is a 69 year old gentleman with a history of a prior heart transplant, and history of cerebrovascular disease, with left-sided weakness. The patient has developed some issues with balance with walking within the last 3-6 months. He is being evaluated for possible neuropathy.  NERVE CONDUCTION STUDIES:  Nerve conduction studies were performed on both lower extremities. The distal motor latencies and motor amplitudes for the peroneal and posterior tibial nerves were within normal limits. The nerve conduction velocities for these nerves were also normal. The H reflex latencies were normal. The sensory latencies for the peroneal nerves were within normal limits.   EMG STUDIES:  EMG study was performed on the right lower extremity:  The tibialis anterior muscle reveals 2 to 4K motor units with full recruitment. No fibrillations or positive waves were seen. The peroneus tertius muscle reveals 2 to 5K motor units with full recruitment. No fibrillations or positive waves were seen. The medial gastrocnemius muscle reveals 1 to 3K motor units with full recruitment. No fibrillations or positive waves were seen. The vastus lateralis muscle reveals 2 to 4K motor units with full recruitment. No fibrillations or positive waves were seen. The iliopsoas muscle reveals 2 to 4K motor units with full recruitment. No fibrillations or positive waves were seen. The biceps femoris muscle (long head) reveals 2 to 4K motor units with full recruitment. No fibrillations or positive waves were seen. The lumbosacral paraspinal muscles were tested at 3 levels, and revealed no abnormalities of insertional activity at all 3 levels tested. There was good relaxation.   IMPRESSION:  Nerve conduction studies done on both lower extremities were within normal limits. No evidence of a peripheral neuropathy was seen. EMG evaluation of the right lower extremity was relatively unremarkable, without  evidence of an overlying lumbosacral radiculopathy.  Jill Alexanders MD 12/02/2014 1:49 PM  [facility] [address] [place], [place] [address]  Phone [phone] Fax [phone]

## 2014-12-02 NOTE — Progress Notes (Signed)
Please refer to EMG and nerve conduction study procedure note. 

## 2014-12-03 ENCOUNTER — Telehealth: Payer: Self-pay

## 2014-12-03 NOTE — Telephone Encounter (Signed)
Left vm to give results, okay to  inform the patient that nerve conduction study and EMG were normal

## 2014-12-05 NOTE — Telephone Encounter (Signed)
Left vm for patient regarding test results.If patient calls back its okay to   inform him  that EMG/Nerve condution study was normal.

## 2014-12-25 DIAGNOSIS — C4442 Squamous cell carcinoma of skin of scalp and neck: Secondary | ICD-10-CM | POA: Diagnosis not present

## 2014-12-25 DIAGNOSIS — C44319 Basal cell carcinoma of skin of other parts of face: Secondary | ICD-10-CM | POA: Diagnosis not present

## 2014-12-25 DIAGNOSIS — Z08 Encounter for follow-up examination after completed treatment for malignant neoplasm: Secondary | ICD-10-CM | POA: Diagnosis not present

## 2014-12-25 DIAGNOSIS — C44329 Squamous cell carcinoma of skin of other parts of face: Secondary | ICD-10-CM | POA: Diagnosis not present

## 2014-12-25 DIAGNOSIS — D485 Neoplasm of uncertain behavior of skin: Secondary | ICD-10-CM | POA: Diagnosis not present

## 2014-12-25 DIAGNOSIS — L570 Actinic keratosis: Secondary | ICD-10-CM | POA: Diagnosis not present

## 2015-01-07 DIAGNOSIS — Z8679 Personal history of other diseases of the circulatory system: Secondary | ICD-10-CM | POA: Diagnosis not present

## 2015-01-07 DIAGNOSIS — Z941 Heart transplant status: Secondary | ICD-10-CM | POA: Diagnosis not present

## 2015-01-07 DIAGNOSIS — Z01810 Encounter for preprocedural cardiovascular examination: Secondary | ICD-10-CM | POA: Diagnosis not present

## 2015-01-07 DIAGNOSIS — G4701 Insomnia due to medical condition: Secondary | ICD-10-CM | POA: Diagnosis not present

## 2015-01-07 DIAGNOSIS — Z4821 Encounter for aftercare following heart transplant: Secondary | ICD-10-CM | POA: Diagnosis not present

## 2015-01-07 DIAGNOSIS — Z125 Encounter for screening for malignant neoplasm of prostate: Secondary | ICD-10-CM | POA: Diagnosis not present

## 2015-01-07 DIAGNOSIS — T8621 Heart transplant rejection: Secondary | ICD-10-CM | POA: Diagnosis not present

## 2015-01-07 DIAGNOSIS — Z79899 Other long term (current) drug therapy: Secondary | ICD-10-CM | POA: Diagnosis not present

## 2015-01-07 DIAGNOSIS — I25811 Atherosclerosis of native coronary artery of transplanted heart without angina pectoris: Secondary | ICD-10-CM | POA: Diagnosis not present

## 2015-01-07 DIAGNOSIS — Z48298 Encounter for aftercare following other organ transplant: Secondary | ICD-10-CM | POA: Diagnosis not present

## 2015-01-09 DIAGNOSIS — R899 Unspecified abnormal finding in specimens from other organs, systems and tissues: Secondary | ICD-10-CM | POA: Diagnosis not present

## 2015-01-09 DIAGNOSIS — N183 Chronic kidney disease, stage 3 (moderate): Secondary | ICD-10-CM | POA: Diagnosis not present

## 2015-01-09 DIAGNOSIS — Z79899 Other long term (current) drug therapy: Secondary | ICD-10-CM | POA: Diagnosis not present

## 2015-01-22 DIAGNOSIS — C44329 Squamous cell carcinoma of skin of other parts of face: Secondary | ICD-10-CM | POA: Diagnosis not present

## 2015-01-22 DIAGNOSIS — C4442 Squamous cell carcinoma of skin of scalp and neck: Secondary | ICD-10-CM | POA: Diagnosis not present

## 2015-01-27 DIAGNOSIS — Z6826 Body mass index (BMI) 26.0-26.9, adult: Secondary | ICD-10-CM | POA: Diagnosis not present

## 2015-01-27 DIAGNOSIS — G4700 Insomnia, unspecified: Secondary | ICD-10-CM | POA: Diagnosis not present

## 2015-01-27 DIAGNOSIS — I10 Essential (primary) hypertension: Secondary | ICD-10-CM | POA: Diagnosis not present

## 2015-02-12 DIAGNOSIS — C44319 Basal cell carcinoma of skin of other parts of face: Secondary | ICD-10-CM | POA: Diagnosis not present

## 2015-02-20 DIAGNOSIS — Z23 Encounter for immunization: Secondary | ICD-10-CM | POA: Diagnosis not present

## 2015-02-20 DIAGNOSIS — Z941 Heart transplant status: Secondary | ICD-10-CM | POA: Diagnosis not present

## 2015-02-20 DIAGNOSIS — Z48298 Encounter for aftercare following other organ transplant: Secondary | ICD-10-CM | POA: Diagnosis not present

## 2015-03-03 DIAGNOSIS — Z5181 Encounter for therapeutic drug level monitoring: Secondary | ICD-10-CM | POA: Diagnosis not present

## 2015-03-03 DIAGNOSIS — Z48298 Encounter for aftercare following other organ transplant: Secondary | ICD-10-CM | POA: Diagnosis not present

## 2015-03-03 DIAGNOSIS — E785 Hyperlipidemia, unspecified: Secondary | ICD-10-CM | POA: Diagnosis not present

## 2015-03-03 DIAGNOSIS — I10 Essential (primary) hypertension: Secondary | ICD-10-CM | POA: Diagnosis not present

## 2015-03-03 DIAGNOSIS — Z941 Heart transplant status: Secondary | ICD-10-CM | POA: Diagnosis not present

## 2015-03-03 DIAGNOSIS — G4700 Insomnia, unspecified: Secondary | ICD-10-CM | POA: Diagnosis not present

## 2015-03-03 DIAGNOSIS — I371 Nonrheumatic pulmonary valve insufficiency: Secondary | ICD-10-CM | POA: Diagnosis not present

## 2015-03-03 DIAGNOSIS — Z4821 Encounter for aftercare following heart transplant: Secondary | ICD-10-CM | POA: Diagnosis not present

## 2015-03-03 DIAGNOSIS — I517 Cardiomegaly: Secondary | ICD-10-CM | POA: Diagnosis not present

## 2015-03-03 DIAGNOSIS — Z79899 Other long term (current) drug therapy: Secondary | ICD-10-CM | POA: Diagnosis not present

## 2015-03-27 DIAGNOSIS — Z941 Heart transplant status: Secondary | ICD-10-CM | POA: Diagnosis not present

## 2015-03-27 DIAGNOSIS — Z48298 Encounter for aftercare following other organ transplant: Secondary | ICD-10-CM | POA: Diagnosis not present

## 2015-05-01 ENCOUNTER — Encounter: Payer: Self-pay | Admitting: Neurology

## 2015-05-12 ENCOUNTER — Telehealth: Payer: Self-pay | Admitting: *Deleted

## 2015-05-12 NOTE — Telephone Encounter (Signed)
LMVM for wife about rescheduling appt with CM/NP.

## 2015-05-13 DIAGNOSIS — C44519 Basal cell carcinoma of skin of other part of trunk: Secondary | ICD-10-CM | POA: Diagnosis not present

## 2015-05-13 DIAGNOSIS — Z85828 Personal history of other malignant neoplasm of skin: Secondary | ICD-10-CM | POA: Diagnosis not present

## 2015-05-13 DIAGNOSIS — D045 Carcinoma in situ of skin of trunk: Secondary | ICD-10-CM | POA: Diagnosis not present

## 2015-05-13 DIAGNOSIS — L814 Other melanin hyperpigmentation: Secondary | ICD-10-CM | POA: Diagnosis not present

## 2015-05-13 DIAGNOSIS — D1801 Hemangioma of skin and subcutaneous tissue: Secondary | ICD-10-CM | POA: Diagnosis not present

## 2015-05-13 DIAGNOSIS — Z79899 Other long term (current) drug therapy: Secondary | ICD-10-CM | POA: Diagnosis not present

## 2015-05-13 DIAGNOSIS — L570 Actinic keratosis: Secondary | ICD-10-CM | POA: Diagnosis not present

## 2015-05-13 DIAGNOSIS — D485 Neoplasm of uncertain behavior of skin: Secondary | ICD-10-CM | POA: Diagnosis not present

## 2015-05-13 DIAGNOSIS — C4442 Squamous cell carcinoma of skin of scalp and neck: Secondary | ICD-10-CM | POA: Diagnosis not present

## 2015-05-13 DIAGNOSIS — C44529 Squamous cell carcinoma of skin of other part of trunk: Secondary | ICD-10-CM | POA: Diagnosis not present

## 2015-05-14 ENCOUNTER — Ambulatory Visit: Payer: Medicare Other | Admitting: Neurology

## 2015-06-02 DIAGNOSIS — I10 Essential (primary) hypertension: Secondary | ICD-10-CM | POA: Diagnosis not present

## 2015-06-02 DIAGNOSIS — G4700 Insomnia, unspecified: Secondary | ICD-10-CM | POA: Diagnosis not present

## 2015-06-02 DIAGNOSIS — Z125 Encounter for screening for malignant neoplasm of prostate: Secondary | ICD-10-CM | POA: Diagnosis not present

## 2015-06-02 DIAGNOSIS — Z6827 Body mass index (BMI) 27.0-27.9, adult: Secondary | ICD-10-CM | POA: Diagnosis not present

## 2015-06-25 DIAGNOSIS — C44529 Squamous cell carcinoma of skin of other part of trunk: Secondary | ICD-10-CM | POA: Diagnosis not present

## 2015-06-25 DIAGNOSIS — C4442 Squamous cell carcinoma of skin of scalp and neck: Secondary | ICD-10-CM | POA: Diagnosis not present

## 2015-06-25 DIAGNOSIS — L905 Scar conditions and fibrosis of skin: Secondary | ICD-10-CM | POA: Diagnosis not present

## 2015-08-12 DIAGNOSIS — L821 Other seborrheic keratosis: Secondary | ICD-10-CM | POA: Diagnosis not present

## 2015-08-12 DIAGNOSIS — L814 Other melanin hyperpigmentation: Secondary | ICD-10-CM | POA: Diagnosis not present

## 2015-08-12 DIAGNOSIS — D1801 Hemangioma of skin and subcutaneous tissue: Secondary | ICD-10-CM | POA: Diagnosis not present

## 2015-08-12 DIAGNOSIS — L570 Actinic keratosis: Secondary | ICD-10-CM | POA: Diagnosis not present

## 2015-08-12 DIAGNOSIS — D485 Neoplasm of uncertain behavior of skin: Secondary | ICD-10-CM | POA: Diagnosis not present

## 2015-08-12 DIAGNOSIS — D0462 Carcinoma in situ of skin of left upper limb, including shoulder: Secondary | ICD-10-CM | POA: Diagnosis not present

## 2015-08-12 DIAGNOSIS — Z85828 Personal history of other malignant neoplasm of skin: Secondary | ICD-10-CM | POA: Diagnosis not present

## 2015-08-20 DIAGNOSIS — D0462 Carcinoma in situ of skin of left upper limb, including shoulder: Secondary | ICD-10-CM | POA: Diagnosis not present

## 2015-08-20 DIAGNOSIS — D045 Carcinoma in situ of skin of trunk: Secondary | ICD-10-CM | POA: Diagnosis not present

## 2015-08-20 DIAGNOSIS — C44519 Basal cell carcinoma of skin of other part of trunk: Secondary | ICD-10-CM | POA: Diagnosis not present

## 2015-08-20 DIAGNOSIS — C44529 Squamous cell carcinoma of skin of other part of trunk: Secondary | ICD-10-CM | POA: Diagnosis not present

## 2015-09-11 DIAGNOSIS — Z48298 Encounter for aftercare following other organ transplant: Secondary | ICD-10-CM | POA: Diagnosis not present

## 2015-09-11 DIAGNOSIS — R5383 Other fatigue: Secondary | ICD-10-CM | POA: Diagnosis not present

## 2015-09-11 DIAGNOSIS — Z125 Encounter for screening for malignant neoplasm of prostate: Secondary | ICD-10-CM | POA: Diagnosis not present

## 2015-09-11 DIAGNOSIS — Z79899 Other long term (current) drug therapy: Secondary | ICD-10-CM | POA: Diagnosis not present

## 2015-09-11 DIAGNOSIS — R0609 Other forms of dyspnea: Secondary | ICD-10-CM | POA: Diagnosis not present

## 2015-09-11 DIAGNOSIS — R5381 Other malaise: Secondary | ICD-10-CM | POA: Diagnosis not present

## 2015-09-11 DIAGNOSIS — Z941 Heart transplant status: Secondary | ICD-10-CM | POA: Diagnosis not present

## 2015-09-23 DIAGNOSIS — L570 Actinic keratosis: Secondary | ICD-10-CM | POA: Diagnosis not present

## 2015-09-30 DIAGNOSIS — N183 Chronic kidney disease, stage 3 (moderate): Secondary | ICD-10-CM | POA: Diagnosis not present

## 2015-09-30 DIAGNOSIS — I2510 Atherosclerotic heart disease of native coronary artery without angina pectoris: Secondary | ICD-10-CM | POA: Diagnosis not present

## 2015-09-30 DIAGNOSIS — Z79899 Other long term (current) drug therapy: Secondary | ICD-10-CM | POA: Diagnosis not present

## 2015-09-30 DIAGNOSIS — E785 Hyperlipidemia, unspecified: Secondary | ICD-10-CM | POA: Diagnosis not present

## 2015-09-30 DIAGNOSIS — I4891 Unspecified atrial fibrillation: Secondary | ICD-10-CM | POA: Diagnosis not present

## 2015-09-30 DIAGNOSIS — M1000 Idiopathic gout, unspecified site: Secondary | ICD-10-CM | POA: Diagnosis not present

## 2015-10-03 ENCOUNTER — Emergency Department (HOSPITAL_COMMUNITY)
Admission: EM | Admit: 2015-10-03 | Discharge: 2015-10-04 | Disposition: A | Discharge status: Home / Self Care | Payer: Medicare Other | Attending: Emergency Medicine | Admitting: Emergency Medicine

## 2015-10-03 ENCOUNTER — Encounter (HOSPITAL_COMMUNITY): Payer: Self-pay | Admitting: *Deleted

## 2015-10-03 DIAGNOSIS — I25811 Atherosclerosis of native coronary artery of transplanted heart without angina pectoris: Secondary | ICD-10-CM | POA: Diagnosis not present

## 2015-10-03 DIAGNOSIS — F329 Major depressive disorder, single episode, unspecified: Secondary | ICD-10-CM | POA: Diagnosis not present

## 2015-10-03 DIAGNOSIS — E785 Hyperlipidemia, unspecified: Secondary | ICD-10-CM | POA: Diagnosis not present

## 2015-10-03 DIAGNOSIS — I509 Heart failure, unspecified: Secondary | ICD-10-CM | POA: Insufficient documentation

## 2015-10-03 DIAGNOSIS — Z87891 Personal history of nicotine dependence: Secondary | ICD-10-CM | POA: Diagnosis not present

## 2015-10-03 DIAGNOSIS — Z7982 Long term (current) use of aspirin: Secondary | ICD-10-CM | POA: Insufficient documentation

## 2015-10-03 DIAGNOSIS — Z8673 Personal history of transient ischemic attack (TIA), and cerebral infarction without residual deficits: Secondary | ICD-10-CM | POA: Diagnosis not present

## 2015-10-03 DIAGNOSIS — Z79899 Other long term (current) drug therapy: Secondary | ICD-10-CM | POA: Insufficient documentation

## 2015-10-03 DIAGNOSIS — I4891 Unspecified atrial fibrillation: Secondary | ICD-10-CM | POA: Diagnosis not present

## 2015-10-03 DIAGNOSIS — Z85820 Personal history of malignant melanoma of skin: Secondary | ICD-10-CM | POA: Diagnosis not present

## 2015-10-03 DIAGNOSIS — F29 Unspecified psychosis not due to a substance or known physiological condition: Secondary | ICD-10-CM | POA: Diagnosis not present

## 2015-10-03 DIAGNOSIS — N183 Chronic kidney disease, stage 3 (moderate): Secondary | ICD-10-CM | POA: Diagnosis not present

## 2015-10-03 DIAGNOSIS — I130 Hypertensive heart and chronic kidney disease with heart failure and stage 1 through stage 4 chronic kidney disease, or unspecified chronic kidney disease: Secondary | ICD-10-CM | POA: Insufficient documentation

## 2015-10-03 DIAGNOSIS — R45851 Suicidal ideations: Secondary | ICD-10-CM | POA: Insufficient documentation

## 2015-10-03 DIAGNOSIS — Z046 Encounter for general psychiatric examination, requested by authority: Secondary | ICD-10-CM | POA: Diagnosis present

## 2015-10-03 DIAGNOSIS — F4323 Adjustment disorder with mixed anxiety and depressed mood: Secondary | ICD-10-CM

## 2015-10-03 LAB — COMPREHENSIVE METABOLIC PANEL
ALT: 13 U/L — ABNORMAL LOW (ref 17–63)
AST: 20 U/L (ref 15–41)
Albumin: 4.6 g/dL (ref 3.5–5.0)
Alkaline Phosphatase: 56 U/L (ref 38–126)
Anion gap: 7 (ref 5–15)
BILIRUBIN TOTAL: 0.6 mg/dL (ref 0.3–1.2)
BUN: 21 mg/dL — ABNORMAL HIGH (ref 6–20)
CHLORIDE: 111 mmol/L (ref 101–111)
CO2: 19 mmol/L — ABNORMAL LOW (ref 22–32)
Calcium: 8.8 mg/dL — ABNORMAL LOW (ref 8.9–10.3)
Creatinine, Ser: 1.61 mg/dL — ABNORMAL HIGH (ref 0.61–1.24)
GFR calc non Af Amer: 41 mL/min — ABNORMAL LOW (ref >60)
GFR, EST AFRICAN AMERICAN: 48 mL/min — AB (ref >60)
Glucose, Bld: 99 mg/dL (ref 65–99)
POTASSIUM: 3.5 mmol/L (ref 3.5–5.1)
Sodium: 137 mmol/L (ref 135–145)
TOTAL PROTEIN: 7.6 g/dL (ref 6.5–8.1)

## 2015-10-03 LAB — CBC WITH DIFFERENTIAL/PLATELET
BASOS ABS: 0 10*3/uL (ref 0.0–0.1)
Basophils Relative: 1 %
EOS PCT: 1 %
Eosinophils Absolute: 0.1 10*3/uL (ref 0.0–0.7)
HCT: 40.7 % (ref 39.0–52.0)
Hemoglobin: 13.1 g/dL (ref 13.0–17.0)
LYMPHS PCT: 14 %
Lymphs Abs: 1.2 10*3/uL (ref 0.7–4.0)
MCH: 29.5 pg (ref 26.0–34.0)
MCHC: 32.2 g/dL (ref 30.0–36.0)
MCV: 91.7 fL (ref 78.0–100.0)
MONOS PCT: 6 %
Monocytes Absolute: 0.5 10*3/uL (ref 0.1–1.0)
Neutro Abs: 6.5 10*3/uL (ref 1.7–7.7)
Neutrophils Relative %: 78 %
PLATELETS: 275 10*3/uL (ref 150–400)
RBC: 4.44 MIL/uL (ref 4.22–5.81)
RDW: 14.3 % (ref 11.5–15.5)
WBC: 8.4 10*3/uL (ref 4.0–10.5)

## 2015-10-03 LAB — URINALYSIS, ROUTINE W REFLEX MICROSCOPIC
Bilirubin Urine: NEGATIVE
Glucose, UA: NEGATIVE mg/dL
Hgb urine dipstick: NEGATIVE
Ketones, ur: NEGATIVE mg/dL
Leukocytes, UA: NEGATIVE
NITRITE: NEGATIVE
Protein, ur: 30 mg/dL — AB
SPECIFIC GRAVITY, URINE: 1.025 (ref 1.005–1.030)
pH: 5.5 (ref 5.0–8.0)

## 2015-10-03 LAB — URINE MICROSCOPIC-ADD ON: RBC / HPF: NONE SEEN RBC/hpf (ref 0–5)

## 2015-10-03 LAB — RAPID URINE DRUG SCREEN, HOSP PERFORMED
Amphetamines: NOT DETECTED
BARBITURATES: NOT DETECTED
BENZODIAZEPINES: NOT DETECTED
COCAINE: NOT DETECTED
OPIATES: NOT DETECTED
TETRAHYDROCANNABINOL: NOT DETECTED

## 2015-10-03 LAB — ACETAMINOPHEN LEVEL: Acetaminophen (Tylenol), Serum: <10 ug/mL — ABNORMAL LOW (ref 10–30)

## 2015-10-03 LAB — ETHANOL: Alcohol, Ethyl (B): <5 mg/dL (ref <5)

## 2015-10-03 LAB — SALICYLATE LEVEL

## 2015-10-03 MED ORDER — ALPRAZOLAM 0.5 MG PO TABS
0.5000 mg | ORAL_TABLET | Freq: Once | ORAL | Status: AC
  Administered 2015-10-04: 0.5 mg via ORAL
  Filled 2015-10-03: qty 1

## 2015-10-03 NOTE — ED Notes (Signed)
Pt is in paper scrubs and has ben wanded by security as well.

## 2015-10-03 NOTE — ED Notes (Addendum)
Pt states his wife was suffering from stage 3 lung cancer. Pt states he fixed her breakfast this morning and they ate together. Pt states he went out to wash the deck off and his wife was kneeling down and knocked on the back sliding gas door. Pt reports when he opened the sliding glass door, his wife fell out and was bleeding through her nose and mouth. Pt's wife passed away. Pt states he was devastated, called 911 and when they got there, he asked the deputy to "leave me a gun." Pt states his wife was the love of his life and he has no reason to live without her. Deputy was concerned and tried to call family but the pt is estranged from his family and [name] form the [facility] team came out to the pt's house and advised this pt that he needed to come to the ED to be evaluated and he could either come voluntary or they would have to involuntary commit him. Pt came willingly but stated "I had no choice really."

## 2015-10-03 NOTE — ED Provider Notes (Signed)
CSN: [id number]     Arrival date & time 10/03/15  1955 History   First MD Initiated Contact with Patient 10/03/15 2026     Chief Complaint  Patient presents with  . V70.1     (Consider location/radiation/quality/duration/timing/severity/associated sxs/prior Treatment) HPI  The patient is a 70 year old male, he has a history of heart failure, coronary disease, atrial fibrillation and has had a heart transplant in 2012. The patient reports from home where he is accompanied by deputies and a Education officer, museum after he had unexpectedly witnessed his wife's death. He reports that she had advanced lung cancer and hemorrhage at home, when paramedics arrived she was dead. The patient reports that after this occurred he was unable to resuscitate her, he felt extremely distressed and made some comments about possibly wanting to die asking the sheriff to leave his gun at the house for him. The patient states he just wants to go home and increase as he lost "a lot of of my life". He denies feeling suicidal at this time.  Past Medical History  Diagnosis Date  . Heart failure ([place])   . Heart transplanted ([place])   . CAD (coronary artery disease), native artery transplanted heart   . Atrial fibrillation ([place])   . Chronic kidney disease, stage 3   . Hyperlipidemia   . Insomnia   . Skin cancer   . Cellulitis     ELBOW AND TOE  . Depression   . Anxiety   . Hypertension   . Stroke [facility])     2 strokes w/in 24 hrs of cardioversion   Past Surgical History  Procedure Laterality Date  . Heart transplant  2012    [facility]  . Coronary angioplasty with stent placement  01/2014    in transplanted heart at [facility]  . Knee surgery Left 2008  . Eye surgery Right 2009    [facility]   Family History  Problem Relation Age of Onset  . Heart disease Mother   . Stroke Neg Hx   . Heart disease Father    Social History  Substance Use Topics  . Smoking status: Former Smoker -- 2.00 packs/day    Types: Cigarettes    Quit  date: 02/14/2010  . Smokeless tobacco: Former Systems developer    Types: Chew  . Alcohol Use: No     Comment: QUIT IN 2011    Review of Systems  All other systems reviewed and are negative.     Allergies  Review of patient's allergies indicates no known allergies.  Home Medications   Prior to Admission medications   Medication Sig Start Date End Date Taking? Authorizing Provider  amLODipine (NORVASC) 5 MG tablet Take 5 mg by mouth daily.    Historical Provider, MD  aspirin 81 MG tablet Take 162 mg by mouth every evening.    Historical Provider, MD  Calcium Carbonate-Vitamin D (CALCIUM-VITAMIN D) 500-200 MG-UNIT per tablet Take 1 tablet by mouth daily.    Historical Provider, MD  clonazePAM (KLONOPIN) 0.5 MG tablet Take 0.5 mg by mouth at bedtime as needed for anxiety (sleep).     Historical Provider, MD  clopidogrel (PLAVIX) 75 MG tablet Take 75 mg by mouth daily.    Historical Provider, MD  cycloSPORINE modified (GENGRAF) 25 MG capsule Take 75 mg by mouth 2 (two) times daily.     Historical Provider, MD  gabapentin (NEURONTIN) 300 MG capsule Take 1 capsule (300 mg total) by mouth at bedtime. Patient not taking: Reported on 11/14/2014 05/13/14  Micki Riley, MD  hydrALAZINE (APRESOLINE) 50 MG tablet Take 50 mg by mouth 2 (two) times daily.     Historical Provider, MD  ibuprofen (ADVIL,MOTRIN) 200 MG tablet Take 600-800 mg by mouth every 6 (six) hours as needed for mild pain.    Historical Provider, MD  lisinopril (PRINIVIL,ZESTRIL) 5 MG tablet Take 5 mg by mouth daily.    Historical Provider, MD  meclizine (ANTIVERT) 25 MG tablet Take 1 tablet (25 mg total) by mouth 3 (three) times daily as needed for dizziness. Patient not taking: Reported on 11/14/2014 06/22/14   Donnetta Hutching, MD  Multiple Vitamin (MULTI-VITAMINS) TABS Take by mouth. 07/07/10   Historical Provider, MD  mycophenolate (CELLCEPT) 500 MG tablet Take 1,000 mg by mouth 2 (two) times daily.    Historical Provider, MD  rosuvastatin  (CRESTOR) 5 MG tablet Take 5 mg by mouth daily.    Historical Provider, MD  valACYclovir (VALTREX) 1000 MG tablet Take 1,000 mg by mouth as needed (Cold Sores).  07/30/13   Historical Provider, MD   BP 188/86 mmHg  Pulse 93  Temp(Src) 99.1 F (37.3 C) (Oral)  Resp 18  Ht  (1.753 m)  Wt 185 lb (83.915 kg)  BMI 27.31 kg/m2  SpO2 99% Physical Exam  Constitutional: He appears well-developed and well-nourished. No distress.  HENT:  Head: Normocephalic and atraumatic.  Mouth/Throat: Oropharynx is clear and moist. No oropharyngeal exudate.  Eyes: Conjunctivae and EOM are normal. Pupils are equal, round, and reactive to light. Right eye exhibits no discharge. Left eye exhibits no discharge. No scleral icterus.  Neck: Normal range of motion. Neck supple. No JVD present. No thyromegaly present.  Cardiovascular: Normal rate, regular rhythm, normal heart sounds and intact distal pulses.  Exam reveals no gallop and no friction rub.   No murmur heard. Pulmonary/Chest: Effort normal and breath sounds normal. No respiratory distress. He has no wheezes. He has no rales.  Abdominal: Soft. Bowel sounds are normal. He exhibits no distension and no mass. There is no tenderness.  Musculoskeletal: Normal range of motion. He exhibits no edema or tenderness.  Lymphadenopathy:    He has no cervical adenopathy.  Neurological: He is alert. Coordination normal.  Skin: Skin is warm and dry. No rash noted. No erythema.  Psychiatric:  Sad and depressed, denies hallucinations, denies substance abuse  Nursing note and vitals reviewed.   ED Course  Procedures (including critical care time) Labs Review Labs Reviewed  URINALYSIS, ROUTINE W REFLEX MICROSCOPIC (NOT AT [facility]) - Abnormal; Notable for the following:    Protein, ur 30 (*)    All other components within normal limits  URINE MICROSCOPIC-ADD ON - Abnormal; Notable for the following:    Squamous Epithelial / LPF 0-5 (*)    Bacteria, UA RARE (*)     Casts HYALINE CASTS (*)    All other components within normal limits  ETHANOL  URINE RAPID DRUG SCREEN, HOSP PERFORMED  CBC WITH DIFFERENTIAL/PLATELET  COMPREHENSIVE METABOLIC PANEL  CBC WITH DIFFERENTIAL/PLATELET  URINE RAPID DRUG SCREEN, HOSP PERFORMED  SALICYLATE LEVEL  ACETAMINOPHEN LEVEL  URINALYSIS, ROUTINE W REFLEX MICROSCOPIC (NOT AT [facility])    Imaging Review No results found. I have personally reviewed and evaluated these images and lab results as part of my medical decision-making.   EKG Interpretation None      MDM   Final diagnoses:  None    The patient obviously is depressed acutely from an acute grief, whether he is truly suicidal or  not is unclear, I will consult with psychiatry for further evaluation.  At change of shift- care signed out to oncoming physician to follow up on [place], MD 10/05/15 [phone]

## 2015-10-03 NOTE — BH Assessment (Addendum)
Tele Assessment Note   Adam Waters is an 70 y.o. male who presents unaccompanied to [facility] ED due to verbalizing suicidal ideation to Event organiser. Pt reports his wife was suffering from stage 3 lung cancer. Pt says today after breakfast she collapsed and was bleeding through her nose and mouth. Pt called 911 and his wife died. Pt says his wife is "the love of my life" and that he is devastated by her death. He says she was the one who managed everything, "I don't even know my medications because she handled that." Pt asked law enforcement to leave him a gun because he didn't want to live without his wife. He also said that they were going to need two ambulances, one for his wife and one for him. Deputy was concerned and tried to call family but the pt is estranged from his family. Per ED record, Adam Waters from the [facility] team came out to the Pt's house and advised this Pt that he needed to come to the ED to be evaluated and he could either come voluntary or they would have to involuntary commit him. Pt came willingly but stated "I had no choice really."  Pt reports a history of heart transplant in 2012 and says that he was temporarily depressed during the recovery. He denies any other history of mental health problems. Pt states his mood was good prior to today. He denies current suicidal ideation or any history of suicide attempts. Pt denies possessing any firearms. Pt denies current homicidal ideation or history of violence. Pt denies any history of psychotic symptoms. Pt denies any history of alcohol or substance abuse.  Pt says he does not believe he and his wife will be together after death because "she is definitely going to heaven but I won't because I've been a bad person." Pt says he did bad things during his first marriage, such as being unfaithful. Pt reports he has two adult sons but has no relationship with them because they are upset with Pt due to divorcing their mother.  Pt has no one at home tonight who is supportive. Pt reports no history of inpatient or outpatient psychiatric treatment.  Pt is dressed in hospital scrubs, alert, oriented x4 with normal speech and normal motor behavior. Eye contact is good. Pt's mood is sad and affect is congruent with mood. Thought process is coherent and relevant. There is no indication Pt is currently responding to internal stimuli or experiencing delusional thought content. Pt was cooperative throughout assessment. He says he wants to be discharged from ED.   Diagnosis: Unspecified Depressive Disorder  Past Medical History:  Past Medical History  Diagnosis Date  . Heart failure ([place])   . Heart transplanted ([place])   . CAD (coronary artery disease), native artery transplanted heart   . Atrial fibrillation ([place])   . Chronic kidney disease, stage 3   . Hyperlipidemia   . Insomnia   . Skin cancer   . Cellulitis     ELBOW AND TOE  . Depression   . Anxiety   . Hypertension   . Stroke [facility])     2 strokes w/in 24 hrs of cardioversion    Past Surgical History  Procedure Laterality Date  . Heart transplant  2012    [facility]  . Coronary angioplasty with stent placement  01/2014    in transplanted heart at [facility]  . Knee surgery Left 2008  . Eye surgery Right 2009    [facility]  Family History:  Family History  Problem Relation Age of Onset  . Heart disease Mother   . Stroke Neg Hx   . Heart disease Father     Social History:  reports that he quit smoking about 5 years ago. His smoking use included Cigarettes. He smoked 2.00 packs per day. He has quit using smokeless tobacco. His smokeless tobacco use included Chew. He reports that he does not drink alcohol or use illicit drugs.  Additional Social History:  Alcohol / Drug Use Pain Medications: Denies abuse Prescriptions: Denies abuse Over the Counter: Denies abuse History of alcohol / drug use?: No history of alcohol / drug abuse Longest period of sobriety (when/how  long): NA  CIWA: CIWA-Ar BP: 188/86 mmHg Pulse Rate: 93 COWS:    PATIENT STRENGTHS: (choose at least two) Ability for insight Average or above average intelligence Capable of independent living Occupational psychologist fund of knowledge Religious Affiliation Work skills  Allergies: No Known Allergies  Home Medications:  (Not in a hospital admission)  OB/GYN Status:  No LMP for male patient.  General Assessment Data Location of Assessment: [facility] TTS Assessment: In system Is this a Tele or Face-to-Face Assessment?: Tele Assessment Is this an Initial Assessment or a Re-assessment for this encounter?: Initial Assessment Marital status: Widowed [place] name: NA Is patient pregnant?: No Pregnancy Status: No Living Arrangements: Alone Can pt return to current living arrangement?: Yes Admission Status: Voluntary Is patient capable of signing voluntary admission?: Yes Referral Source: Other Risk manager) Insurance type: Medicare     Crisis Care Plan Living Arrangements: Alone Legal Guardian:  (None) Name of Psychiatrist: None Name of Therapist: None  Education Status Is patient currently in school?: No Current Grade: NA Highest grade of school patient has completed: NA Name of school: NA Contact person: None  Risk to self with the past 6 months Suicidal Ideation: Yes-Currently Present Has patient been a risk to self within the past 6 months prior to admission? : Yes Suicidal Intent: No Has patient had any suicidal intent within the past 6 months prior to admission? : No Is patient at risk for suicide?: Yes Suicidal Plan?: Yes-Currently Present Has patient had any suicidal plan within the past 6 months prior to admission? : Yes Specify Current Suicidal Plan: Shoot himself Access to Means: No What has been your use of drugs/alcohol within the last 12 months?: Pt denies Previous Attempts/Gestures: No How many times?: 0 Other Self Harm  Risks: None Triggers for Past Attempts: None known Intentional Self Injurious Behavior: None Family Suicide History: No Recent stressful life event(s): Loss (Comment) (Pt witnessed wife die today) Persecutory voices/beliefs?: No Depression: Yes Depression Symptoms: Despondent Substance abuse history and/or treatment for substance abuse?: No Suicide prevention information given to non-admitted patients: Not applicable  Risk to Others within the past 6 months Homicidal Ideation: No Does patient have any lifetime risk of violence toward others beyond the six months prior to admission? : No Thoughts of Harm to Others: No Current Homicidal Intent: No Current Homicidal Plan: No Access to Homicidal Means: No Identified Victim: None History of harm to others?: No Assessment of Violence: None Noted Violent Behavior Description: None Does patient have access to weapons?: No Criminal Charges Pending?: No Does patient have a court date: No Is patient on probation?: No  Psychosis Hallucinations: None noted Delusions: None noted  Mental Status Report Appearance/Hygiene: In scrubs Eye Contact: Good Motor Activity: Unremarkable Speech: Logical/coherent Level of Consciousness: Alert Mood: Sad Affect:  Appropriate to circumstance Anxiety Level: None Thought Processes: Coherent, Relevant Judgement: Unimpaired Orientation: Person, Place, Time, Situation, Appropriate for developmental age Obsessive Compulsive Thoughts/Behaviors: None  Cognitive Functioning Concentration: Normal Memory: Recent Intact, Remote Intact IQ: Average Insight: Fair Impulse Control: Good Appetite: Good Weight Loss: 0 Weight Gain: 0 Sleep: No Change Total Hours of Sleep: 7 Vegetative Symptoms: None  ADLScreening [facility] Assessment Services) Patient's cognitive ability adequate to safely complete daily activities?: Yes Patient able to express need for assistance with ADLs?: Yes Independently performs ADLs?:  Yes (appropriate for developmental age)  Prior Inpatient Therapy Prior Inpatient Therapy: No Prior Therapy Dates: NA Prior Therapy Facilty/Provider(s): NA Reason for Treatment: NA  Prior Outpatient Therapy Prior Outpatient Therapy: No Prior Therapy Dates: NA Prior Therapy Facilty/Provider(s): NA Reason for Treatment: NA Does patient have an ACCT team?: No Does patient have Intensive In-House Services?  : No Does patient have [facility] services? : No Does patient have P4CC services?: No  ADL Screening (condition at time of admission) Patient's cognitive ability adequate to safely complete daily activities?: Yes Is the patient deaf or have difficulty hearing?: No Does the patient have difficulty seeing, even when wearing glasses/contacts?: No Does the patient have difficulty concentrating, remembering, or making decisions?: No Patient able to express need for assistance with ADLs?: Yes Does the patient have difficulty dressing or bathing?: No Independently performs ADLs?: Yes (appropriate for developmental age) Does the patient have difficulty walking or climbing stairs?: No Weakness of Legs: None Weakness of Arms/Hands: None  Home Assistive Devices/Equipment Home Assistive Devices/Equipment: None    Abuse/Neglect Assessment (Assessment to be complete while patient is alone) Physical Abuse: Denies Verbal Abuse: Denies Sexual Abuse: Denies Exploitation of patient/patient's resources: Denies Self-Neglect: Denies     Regulatory affairs officer (For Healthcare) Does patient have an advance directive?: No Would patient like information on creating an advanced directive?: No - patient declined information    Additional Information 1:1 In Past 12 Months?: No CIRT Risk: No Elopement Risk: No Does patient have medical clearance?: Yes     Disposition: Gave clinical report to Serena Colonel, NP who recommends Pt be evaluated by psychiatry in the morning. Per Karl Ito, RN Pt's wife's  family will be coming to [place] from [place] tomorrow. Notified Dr. Nat Christen and Karl Ito, RN of recommendation.  Disposition Initial Assessment Completed for this Encounter: Yes Disposition of Patient: Other dispositions Other disposition(s): Other (Comment)   Evelena Peat, [facility], [facility], [facility] Triage Specialist [phone]   Anson Fret, Orpah Greek 10/03/2015 11:59 PM

## 2015-10-04 DIAGNOSIS — R45851 Suicidal ideations: Secondary | ICD-10-CM | POA: Diagnosis not present

## 2015-10-04 DIAGNOSIS — F4323 Adjustment disorder with mixed anxiety and depressed mood: Secondary | ICD-10-CM

## 2015-10-04 MED ORDER — ZOLPIDEM TARTRATE 5 MG PO TABS
5.0000 mg | ORAL_TABLET | Freq: Once | ORAL | Status: AC
  Administered 2015-10-04: 5 mg via ORAL
  Filled 2015-10-04: qty 1

## 2015-10-04 NOTE — ED Notes (Signed)
Chaplin gave pt resources and states he believes "he is in a states of shock." He doesn't believe he is at any threat to himself or others."

## 2015-10-04 NOTE — ED Notes (Signed)
Mable Fill is at bedside.

## 2015-10-04 NOTE — ED Notes (Signed)
Pt expresses to nurse that he was upset yesterday and said things because he had lost the love of his life. He states that,"

## 2015-10-04 NOTE — Consult Note (Signed)
Telepsych Consultation   Reason for Consult: Suicidal thoughts due to recent death of wife.  Referring Physician: EDP  Patient Identification: Adam Waters  MRN:  [id number]  Principal Diagnosis: Adjustment disorder with mixed anxiety and depressed mood  Diagnosis:   Patient Active Problem List   Diagnosis Date Noted  . Unsteady gait [R26.81] 07/09/2014  . Abnormality of gait [R26.9] 05/13/2014  . Obstructive sleep apnea [G47.33] 05/13/2014  . Restless legs [G25.81] 05/13/2014  . Acute ischemic stroke ([place]) [I63.9] 02/14/2014  . Stroke [facility]) [I63.9]   . Heart transplanted ([place]) [Z94.1]   . CAD (coronary artery disease), native artery transplanted heart [I25.811]   . Chronic kidney disease, stage 3 [N18.3]   . Hyperlipidemia [E78.5]    Total Time spent with patient: 50 minutes  Subjective:   Adam Waters is a 70 y.o. male patient admitted with complaints of suicidal threats after the sudden death of his wife of 98 years.  HPI: This is a telepsych assessment for Adam Waters, a 70 year old Caucasian male with hx of chronic medical problems including S/P heart transplant. Adam Waters was brought to the [employer] on 10-03-15 for making suicidal threats in front of the Cops after the sudden death of his wife. Adam Waters is currently being evaluated for mental stability. During this assessment, He reports, I was doing doing dishes, then heard my wife hit the floor. I checked, she was lying on the floor with blood coming out of her nose/mouth. The next thing I knew, she was dead. I was in shock. After I called 911 & the cops came, I was still in shock. I could not imagine life without her. I made a comment that the cops should leave me a gun to kill myself. I did not mean to say that. I'm not suicidal. I'm not going to kill myself. I need to be discharged to go home & take care of some business at home. The rest of my family members are coming to the house to help me plan the funeral. Again, I don't want  them to come to the house & see all the blood on the floor. I need to clean it up before they get to the house. I'm a little depressed, but I don't want or think I need medication for it".   Objective: Adam Waters is seen & evaluated. He appears disheveled in the hospital scrub. He is alert, orirnted x 3 & aware of situation. He is verbally responsive. He is able to make concrete requests & decision at this time. His affect is flat. He currently denies any SIHI, AVH, delusional thoughts & or paranoia. He admits feeling depressed, considering his current situation. However, declines to be on any medications for depression at this time.   Per ED:  I have reviewed and concur with HPI elements from my colleague below, modified as follows: Adam Waters is an 70 y.o. male who presents unaccompanied to [facility] ED due to verbalizing suicidal ideation to Event organiser. Pt reports his wife was suffering from stage 3 lung cancer. Pt says today after breakfast she collapsed and was bleeding through her nose and mouth. Pt called 911 and his wife died. Pt says his wife is "the love of my life" and that he is devastated by her death. He says she was the one who managed everything, "I don't even know my medications because she handled that." Pt asked law enforcement to leave him a gun because he didn't want to live  without his wife. He also said that they were going to need two ambulances, one for his wife and one for him. Deputy was concerned and tried to call family but the pt is estranged from his family. Per ED record, Adam Waters from the [facility] team came out to the Pt's house and advised this Pt that he needed to come to the ED to be evaluated and he could either come voluntary or they would have to involuntary commit him. Pt came willingly but stated "I had no choice really."  Pt reports a history of heart transplant in 2012 and says that he was temporarily depressed during the recovery. He denies any  other history of mental health problems. Pt states his mood was good prior to today. He denies current suicidal ideation or any history of suicide attempts. Pt denies possessing any firearms. Pt denies current homicidal ideation or history of violence. Pt denies any history of psychotic symptoms. Pt denies any history of alcohol or substance abuse.  Pt says he does not believe he and his wife will be together after death because "she is definitely going to heaven but I won't because I've been a bad person." Pt says he did bad things during his first marriage, such as being unfaithful. Pt reports he has two adult sons but has no relationship with them because they are upset with Pt due to divorcing their mother. Pt has no one at home tonight who is supportive. Pt reports no history of inpatient or outpatient psychiatric treatment.   Past Psychiatric History: Major depression  Risk to Self: Suicidal Ideation: Yes-Currently Present Suicidal Intent: No Is patient at risk for suicide?: Yes Suicidal Plan?: Yes-Currently Present Specify Current Suicidal Plan: Shoot himself Access to Means: No What has been your use of drugs/alcohol within the last 12 months?: Pt denies How many times?: 0 Other Self Harm Risks: None Triggers for Past Attempts: None known Intentional Self Injurious Behavior: None Risk to Others: Homicidal Ideation: No Thoughts of Harm to Others: No Current Homicidal Intent: No Current Homicidal Plan: No Access to Homicidal Means: No Identified Victim: None History of harm to others?: No Assessment of Violence: None Noted Violent Behavior Description: None Does patient have access to weapons?: No Criminal Charges Pending?: No Does patient have a court date: No Prior Inpatient Therapy: Prior Inpatient Therapy: No Prior Therapy Dates: NA Prior Therapy Facilty/Provider(s): NA Reason for Treatment: NA Prior Outpatient Therapy: Prior Outpatient Therapy: No Prior Therapy Dates:  NA Prior Therapy Facilty/Provider(s): NA Reason for Treatment: NA Does patient have an ACCT team?: No Does patient have Intensive In-House Services?  : No Does patient have [facility] services? : No Does patient have P4CC services?: No  Past Medical History:  Past Medical History  Diagnosis Date  . Heart failure ([place])   . Heart transplanted ([place])   . CAD (coronary artery disease), native artery transplanted heart   . Atrial fibrillation ([place])   . Chronic kidney disease, stage 3   . Hyperlipidemia   . Insomnia   . Skin cancer   . Cellulitis     ELBOW AND TOE  . Depression   . Anxiety   . Hypertension   . Stroke [facility])     2 strokes w/in 24 hrs of cardioversion    Past Surgical History  Procedure Laterality Date  . Heart transplant  2012    [facility]  . Coronary angioplasty with stent placement  01/2014    in transplanted heart at [facility]  .  Knee surgery Left 2008  . Eye surgery Right 2009    [facility]   Family History:  Family History  Problem Relation Age of Onset  . Heart disease Mother   . Stroke Neg Hx   . Heart disease Father    Family Psychiatric  History: Denies  Social History:  History  Alcohol Use No    Comment: QUIT IN 2011     History  Drug Use No    Social History   Social History  . Marital Status: Married    Spouse Name: N/A  . Number of Children: 2  . Years of Education: 12+   Social History Main Topics  . Smoking status: Former Smoker -- 2.00 packs/day    Types: Cigarettes    Quit date: 02/14/2010  . Smokeless tobacco: Former Systems developer    Types: Chew  . Alcohol Use: No     Comment: QUIT IN 2011  . Drug Use: No  . Sexual Activity: Not Asked   Other Topics Concern  . None   Social History Narrative   Lives with wife, no assist device or [place] services.  Primary care for heart transplant is at [facility].        Patient is right handed.   Patient drinks 1 glass of tea daily.   Additional Social History: Patient just lost his wife yesterday  Allergies:   No Known Allergies  Labs:  Results for orders placed or performed during the hospital encounter of 10/03/15 (from the past 48 hour(s))  Ethanol     Status: None   Collection Time: 10/03/15  8:38 PM  Result Value Ref Range   Alcohol, Ethyl (B) <5 <5 mg/dL    Comment:        LOWEST DETECTABLE LIMIT FOR SERUM ALCOHOL IS 5 mg/dL FOR MEDICAL PURPOSES ONLY   Salicylate level     Status: None   Collection Time: 10/03/15  8:38 PM  Result Value Ref Range   Salicylate Lvl <1.6 2.8 - 30.0 mg/dL  Acetaminophen level     Status: Abnormal   Collection Time: 10/03/15  8:38 PM  Result Value Ref Range   Acetaminophen (Tylenol), Serum <10 (L) 10 - 30 ug/mL    Comment:        THERAPEUTIC CONCENTRATIONS VARY SIGNIFICANTLY. A RANGE OF 10-30 ug/mL MAY BE AN EFFECTIVE CONCENTRATION FOR MANY PATIENTS. HOWEVER, SOME ARE BEST TREATED AT CONCENTRATIONS OUTSIDE THIS RANGE. ACETAMINOPHEN CONCENTRATIONS >150 ug/mL AT 4 HOURS AFTER INGESTION AND >50 ug/mL AT 12 HOURS AFTER INGESTION ARE OFTEN ASSOCIATED WITH TOXIC REACTIONS.   Urinalysis, Routine w reflex microscopic (not at [facility])     Status: Abnormal   Collection Time: 10/03/15  8:40 PM  Result Value Ref Range   Color, Urine YELLOW YELLOW   APPearance CLEAR CLEAR   Specific Gravity, Urine 1.025 1.005 - 1.030   pH 5.5 5.0 - 8.0   Glucose, UA NEGATIVE NEGATIVE mg/dL   Hgb urine dipstick NEGATIVE NEGATIVE   Bilirubin Urine NEGATIVE NEGATIVE   Ketones, ur NEGATIVE NEGATIVE mg/dL   Protein, ur 30 (A) NEGATIVE mg/dL   Nitrite NEGATIVE NEGATIVE   Leukocytes, UA NEGATIVE NEGATIVE  Rapid urine drug screen (hospital performed)     Status: None   Collection Time: 10/03/15  8:40 PM  Result Value Ref Range   Opiates NONE DETECTED NONE DETECTED   Cocaine NONE DETECTED NONE DETECTED   Benzodiazepines NONE DETECTED NONE DETECTED   Amphetamines NONE DETECTED NONE DETECTED   Tetrahydrocannabinol NONE  DETECTED NONE DETECTED   Barbiturates NONE DETECTED  NONE DETECTED    Comment:        DRUG SCREEN FOR MEDICAL PURPOSES ONLY.  IF CONFIRMATION IS NEEDED FOR ANY PURPOSE, NOTIFY LAB WITHIN 5 DAYS.        LOWEST DETECTABLE LIMITS FOR URINE DRUG SCREEN Drug Class       Cutoff (ng/mL) Amphetamine      1000 Barbiturate      200 Benzodiazepine   578 Tricyclics       469 Opiates          300 Cocaine          300 THC              50   Urine microscopic-add on     Status: Abnormal   Collection Time: 10/03/15  8:40 PM  Result Value Ref Range   Squamous Epithelial / LPF 0-5 (A) NONE SEEN   WBC, UA 0-5 0 - 5 WBC/hpf   RBC / HPF NONE SEEN 0 - 5 RBC/hpf   Bacteria, UA RARE (A) NONE SEEN   Casts HYALINE CASTS (A) NEGATIVE  CBC with Differential     Status: None   Collection Time: 10/03/15  8:43 PM  Result Value Ref Range   WBC 8.4 4.0 - 10.5 K/uL   RBC 4.44 4.22 - 5.81 MIL/uL   Hemoglobin 13.1 13.0 - 17.0 g/dL   HCT 40.7 39.0 - 52.0 %   MCV 91.7 78.0 - 100.0 fL   MCH 29.5 26.0 - 34.0 pg   MCHC 32.2 30.0 - 36.0 g/dL   RDW 14.3 11.5 - 15.5 %   Platelets 275 150 - 400 K/uL   Neutrophils Relative % 78 %   Neutro Abs 6.5 1.7 - 7.7 K/uL   Lymphocytes Relative 14 %   Lymphs Abs 1.2 0.7 - 4.0 K/uL   Monocytes Relative 6 %   Monocytes Absolute 0.5 0.1 - 1.0 K/uL   Eosinophils Relative 1 %   Eosinophils Absolute 0.1 0.0 - 0.7 K/uL   Basophils Relative 1 %   Basophils Absolute 0.0 0.0 - 0.1 K/uL  Comprehensive metabolic panel     Status: Abnormal   Collection Time: 10/03/15  8:43 PM  Result Value Ref Range   Sodium 137 135 - 145 mmol/L   Potassium 3.5 3.5 - 5.1 mmol/L   Chloride 111 101 - 111 mmol/L   CO2 19 (L) 22 - 32 mmol/L   Glucose, Bld 99 65 - 99 mg/dL   BUN 21 (H) 6 - 20 mg/dL   Creatinine, Ser 1.61 (H) 0.61 - 1.24 mg/dL   Calcium 8.8 (L) 8.9 - 10.3 mg/dL   Total Protein 7.6 6.5 - 8.1 g/dL   Albumin 4.6 3.5 - 5.0 g/dL   AST 20 15 - 41 U/L   ALT 13 (L) 17 - 63 U/L   Alkaline Phosphatase 56 38 - 126 U/L   Total Bilirubin 0.6  0.3 - 1.2 mg/dL   GFR calc non Af Amer 41 (L) >60 mL/min   GFR calc Af Amer 48 (L) >60 mL/min    Comment: (NOTE) The eGFR has been calculated using the CKD EPI equation. This calculation has not been validated in all clinical situations. eGFR's persistently <60 mL/min signify possible Chronic Kidney Disease.    Anion gap 7 5 - 15    No current facility-administered medications for this encounter.   Current Outpatient Prescriptions  Medication Sig Dispense Refill  .  amLODipine (NORVASC) 5 MG tablet Take 5 mg by mouth daily.    [name] aspirin 81 MG tablet Take 162 mg by mouth every evening.    . Calcium Carbonate-Vitamin D (CALCIUM-VITAMIN D) 500-200 MG-UNIT per tablet Take 1 tablet by mouth daily.    . clonazePAM (KLONOPIN) 0.5 MG tablet Take 0.5 mg by mouth at bedtime as needed for anxiety (sleep).     . clopidogrel (PLAVIX) 75 MG tablet Take 75 mg by mouth daily.    . cycloSPORINE modified (GENGRAF) 25 MG capsule Take 75 mg by mouth 2 (two) times daily.     [name] gabapentin (NEURONTIN) 300 MG capsule Take 1 capsule (300 mg total) by mouth at bedtime. (Patient not taking: Reported on 11/14/2014) 90 capsule 11  . hydrALAZINE (APRESOLINE) 50 MG tablet Take 50 mg by mouth 2 (two) times daily.     [name] ibuprofen (ADVIL,MOTRIN) 200 MG tablet Take 600-800 mg by mouth every 6 (six) hours as needed for mild pain.    [name] lisinopril (PRINIVIL,ZESTRIL) 5 MG tablet Take 5 mg by mouth daily.    . meclizine (ANTIVERT) 25 MG tablet Take 1 tablet (25 mg total) by mouth 3 (three) times daily as needed for dizziness. (Patient not taking: Reported on 11/14/2014) 20 tablet 0  . Multiple Vitamin (MULTI-VITAMINS) TABS Take by mouth.    . mycophenolate (CELLCEPT) 500 MG tablet Take 1,000 mg by mouth 2 (two) times daily.    . rosuvastatin (CRESTOR) 5 MG tablet Take 5 mg by mouth daily.    . valACYclovir (VALTREX) 1000 MG tablet Take 1,000 mg by mouth as needed (Cold Sores).      Psychiatric Specialty Exam: Physical Exam   Constitutional: He is oriented to person, place, and time. He appears well-developed.  HENT:  Head: Normocephalic.  Eyes: Pupils are equal, round, and reactive to light.  Neck: Normal range of motion.  Cardiovascular:  Hx. CAD with S/P heart transplant  Musculoskeletal: Normal range of motion.  Neurological: He is alert and oriented to person, place, and time.    Review of Systems  Constitutional: Positive for malaise/fatigue.  HENT: Negative.   Eyes: Negative.   Respiratory: Negative.   Cardiovascular: Negative for chest pain, palpitations, orthopnea and claudication.       Hx. CAD & S/P heart transplant  Gastrointestinal: Negative.   Genitourinary: Negative.   Musculoskeletal: Negative.   Skin: Negative.   Endo/Heme/Allergies: Negative.   Psychiatric/Behavioral: Positive for depression. Negative for suicidal ideas, hallucinations, memory loss and substance abuse. The patient is not nervous/anxious and does not have insomnia.     Blood pressure 180/92, pulse 83, temperature 97.3 F (36.3 C), temperature source Oral, resp. rate 16, height _0  (1.753 m), weight 83.915 kg (185 lb), SpO2 100 %.Body mass index is 27.31 kg/(m^2).  General Appearance: Disheveled  Eye Contact:  Good  Speech:  Clear, coherent, non-spontaneous  Volume:  Normal  Mood:  "I'm sad because my wife just died"  Affect:  Congruent and Flat  Thought Process:  Coherent and Goal Directed  Orientation:  Full (Time, Place, and Person)  Thought Content:  Denies any hallucination, delusional thoughts or paranoia.  Suicidal Thoughts:  Denies, also denies any intent or plans to hurt himself.  Homicidal Thoughts:  Denies, also denies any intent or plans to hurt anyone else  Memory:  Immediate;   Good Recent;   Good Remote;   Good  Judgement:  Good  Insight:  Present  Psychomotor Activity:  Normal  Concentration:  Concentration: Good and Attention Span: Good  Recall:  Good  Fund of Knowledge:  Good  Language:   Good  Akathisia:  Negative  Handed:  Right  AIMS (if indicated):     Assets:  Communication Skills Desire for Improvement Social Support  ADL's:  Intact  Cognition:  WNL  Sleep:      Treatment Plan Summary: Daily contact with patient to assess and evaluate symptoms and progress in treatment: -Patient does not meet criteria for psychiatric inpatient admission as he is currently & adamantly denying SIHI, AVH, Delusional thoughts or paranoia. -Can be discharged to his home if there is always somebody with him at all times during this grieving process/period. Adam Waters admits feeling some depression but decline's to be on any medication at this time. However, will recommend patient seeing a psychiatrist & counseling services after his wife's funeral for management of depression & learning coping skills to deal with the death of a loved one. -In the event of worsening symptoms, Adam Waters is instructed to call the crisis hotline, 911 and or go to the nearest ED for appropriate evaluation and treatment of symptoms. -Rescind IVC (if in place)  -Discharge home with family -Resources given for outpatient psychiatry/counseling services  -SW to call family to confirm safety plan regarding locking up medications, sharps, and weapons (if any in home). See social work notes.  This case & plan of care are reviewed with Dr. Netta Neat, he agrees with the plan & recommendations. Disposition: Patient does not meet criteria for psychiatric inpatient admission. Supportive therapy provided about ongoing stressors.: Met with hospital chaplain for grief counseling this am.  Adam Slates, NP, PMHNP, FNP-BC 10/04/2015 9:28 AM

## 2015-10-04 NOTE — ED Notes (Signed)
TTS in room. Pt being evaluated.

## 2015-10-04 NOTE — B[facility] Counselor (Signed)
Per Pat Patrick, NP  pt. Is cleared for d/c , and [place] NP consulted with Dr. Lenna Sciara. As well. Per [place], pt. Cleared for d/c with relative/ support channels available/ confirmed. Recommend follow up with outpatient resources psychiatry/ counseling.  Spoke with Ander Purpura, pt. Nurse at [facility], and notified of disposition, Per Lauren, pt. Does have relatives coming to pick him up and offer support.  Lynda Wanninger K. Nash Shearer, LPC-A, [facility]  Counselor 10/04/2015 10:12 AM

## 2015-10-04 NOTE — BHH Counselor (Signed)
Spoke with pt. Nurse at [facility] [place]. Request was for chaplain to speak with pt. On grievance issues/ support especially since Oncology was involved for wife in past [more supportive and proper resources may be available through chaplaincy  and Oncology support]. Nurse states she could page chaplain on call for [facility]. This counselor left extension for contact if needed. Also, per [facility] nurse [facility], extender to see patient this a.m. As well.  Anjannette Gauger K. Nash Shearer, LPC-A, [facility]  Counselor 10/04/2015 7:58 AM

## 2015-10-04 NOTE — ED Notes (Signed)
MD at bedside. 

## 2015-10-04 NOTE — ED Notes (Signed)
Pt cleared by [facility] at this time. Jenny Reichmann is on the way here to pick him up.

## 2015-10-04 NOTE — ED Notes (Signed)
Adam Waters(Chaplain) is coming to see the Pt.

## 2015-10-06 DIAGNOSIS — I10 Essential (primary) hypertension: Secondary | ICD-10-CM | POA: Diagnosis not present

## 2015-10-06 DIAGNOSIS — E785 Hyperlipidemia, unspecified: Secondary | ICD-10-CM | POA: Diagnosis not present

## 2015-10-29 DIAGNOSIS — Z48298 Encounter for aftercare following other organ transplant: Secondary | ICD-10-CM | POA: Diagnosis not present

## 2015-10-29 DIAGNOSIS — R5383 Other fatigue: Secondary | ICD-10-CM | POA: Diagnosis not present

## 2015-10-29 DIAGNOSIS — Z941 Heart transplant status: Secondary | ICD-10-CM | POA: Diagnosis not present

## 2015-10-29 DIAGNOSIS — Z79899 Other long term (current) drug therapy: Secondary | ICD-10-CM | POA: Diagnosis not present

## 2015-11-19 DIAGNOSIS — Z79899 Other long term (current) drug therapy: Secondary | ICD-10-CM | POA: Diagnosis not present

## 2015-11-19 DIAGNOSIS — E785 Hyperlipidemia, unspecified: Secondary | ICD-10-CM | POA: Diagnosis not present

## 2015-11-19 DIAGNOSIS — I2510 Atherosclerotic heart disease of native coronary artery without angina pectoris: Secondary | ICD-10-CM | POA: Diagnosis not present

## 2015-12-01 DIAGNOSIS — E785 Hyperlipidemia, unspecified: Secondary | ICD-10-CM | POA: Diagnosis not present

## 2015-12-01 DIAGNOSIS — G463 Brain stem stroke syndrome: Secondary | ICD-10-CM | POA: Diagnosis not present

## 2015-12-20 IMAGING — CR DG CHEST 2V
2 series · 2 of 2 positions shown · non-contrast
Comparison: 03/05/2009.

CLINICAL DATA: Stroke.  History of heart transplant.

EXAM:
CHEST  2 VIEW

[w chest pa]
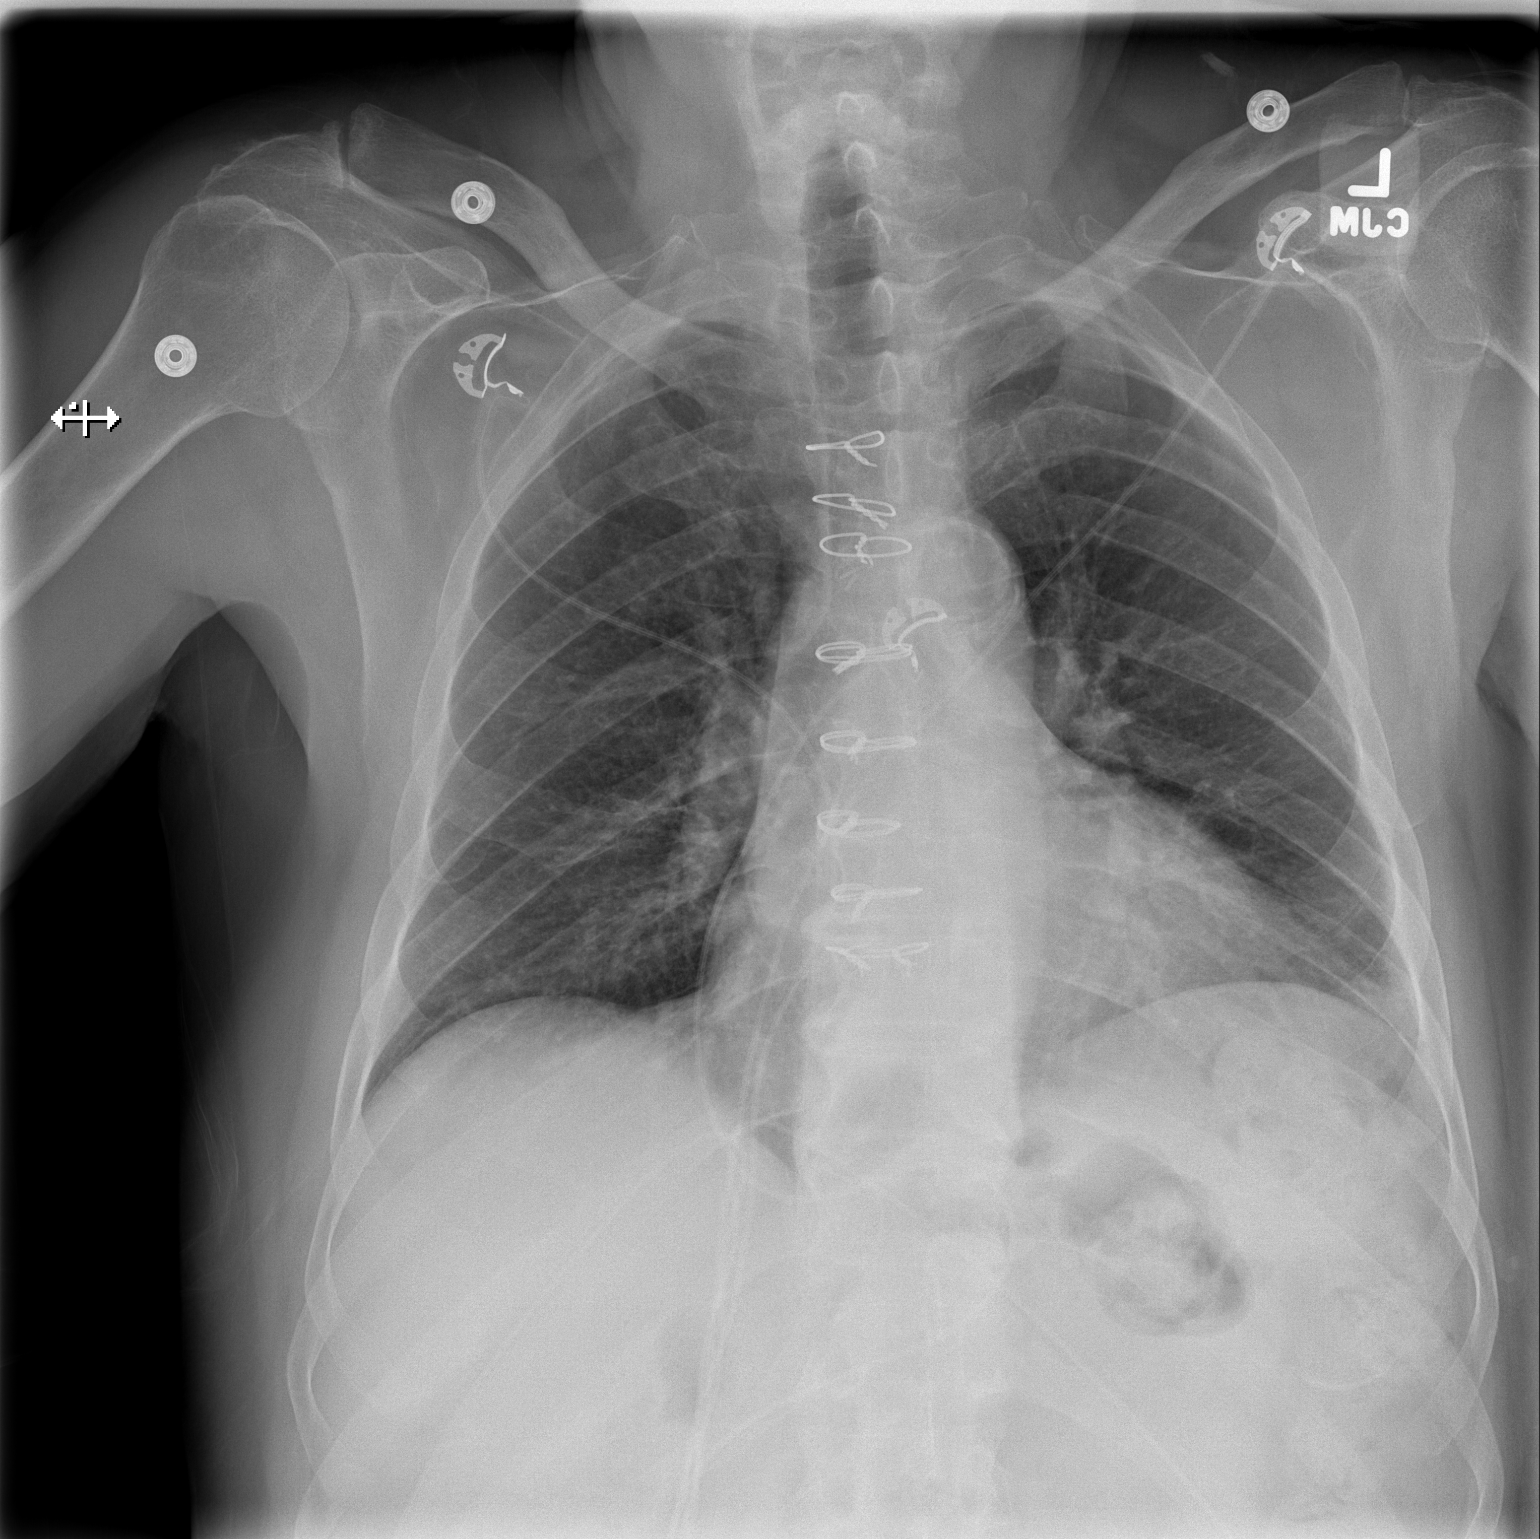

[w chest lat]
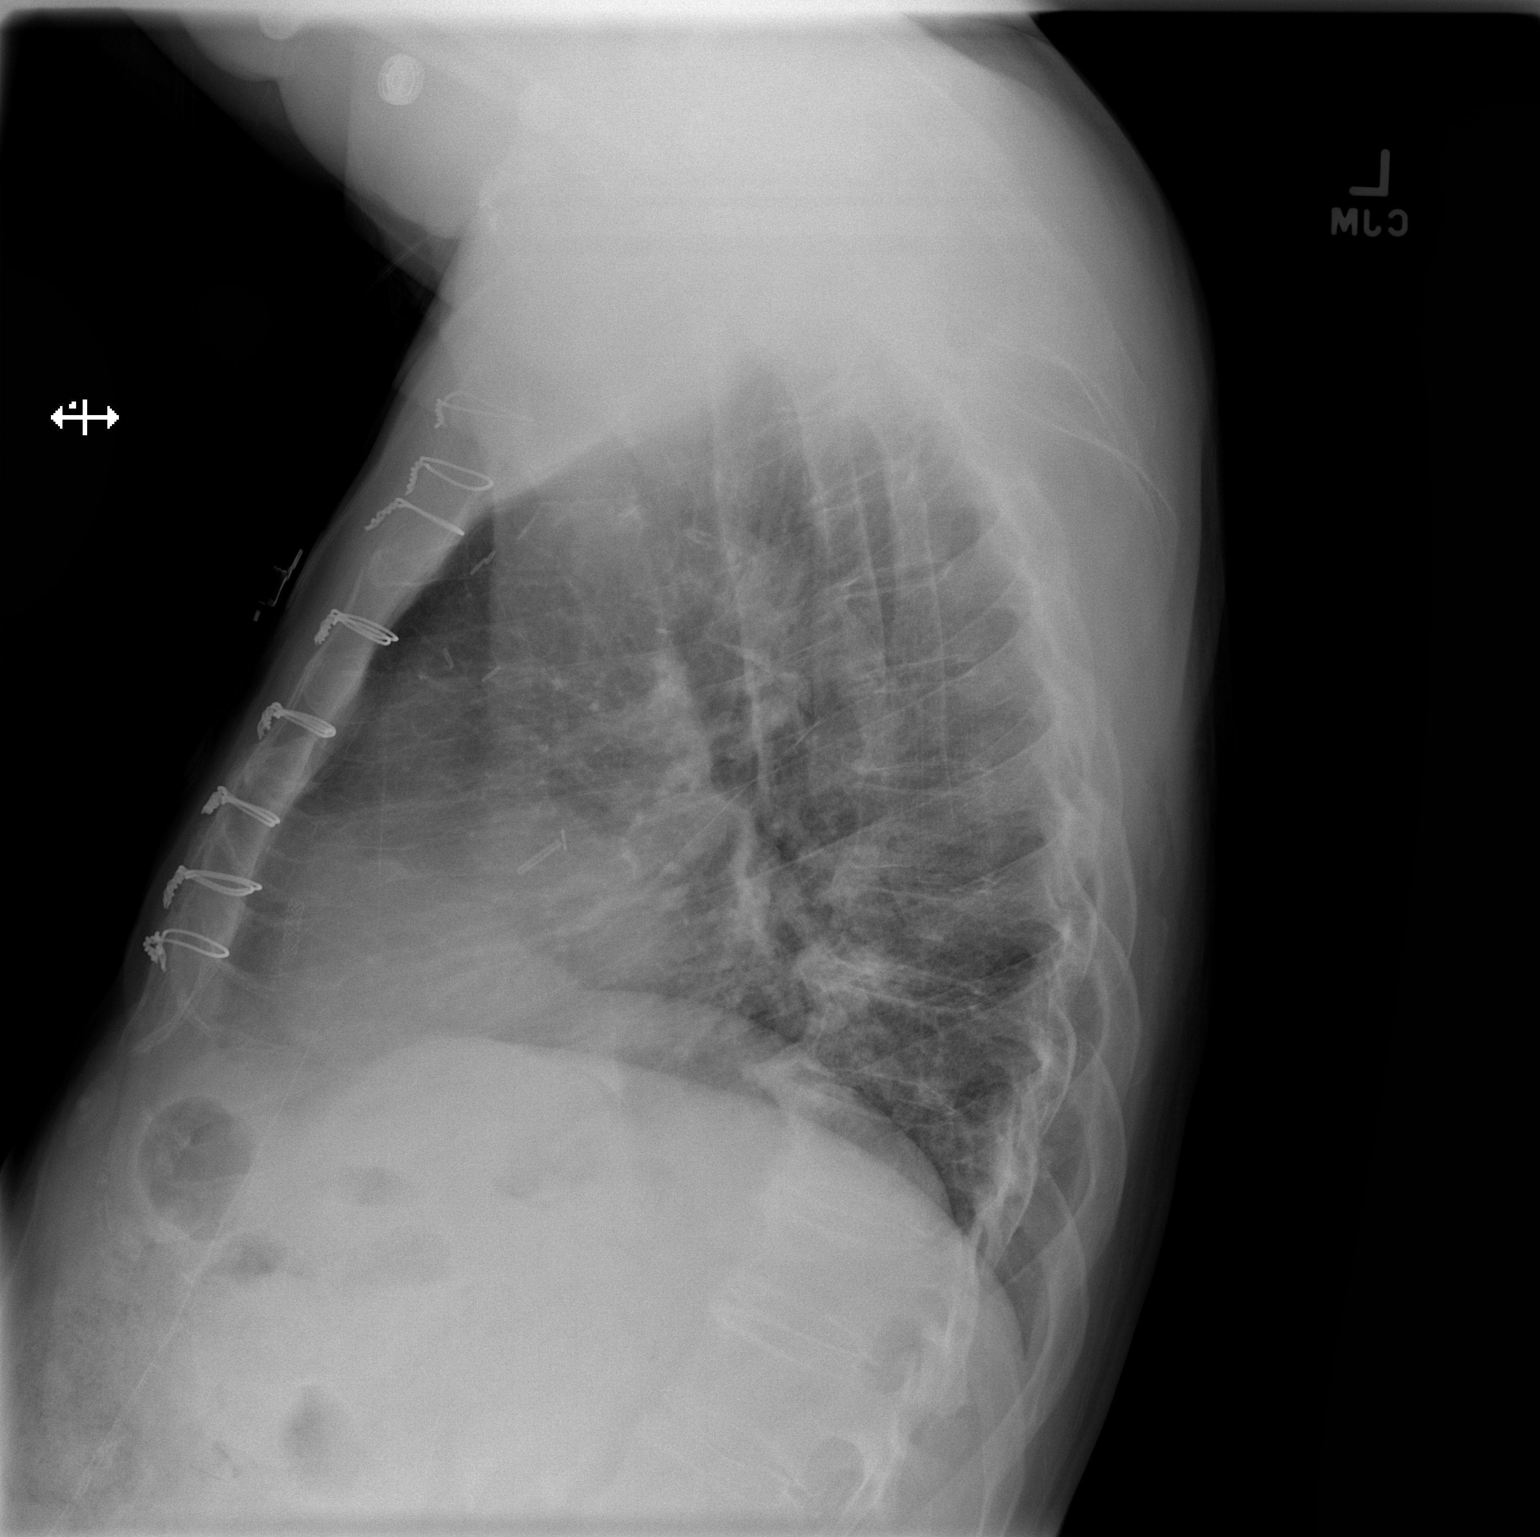

[2 of 2 positions shown; findings below may reference images not displayed]

FINDINGS: Interval median sternotomy wires and mediastinal surgical clips.
Borderline enlarged cardiac silhouette. Clear lungs with normal
vascularity. Mild diffuse prominence of the interstitial markings
without significant change. Thoracic spine degenerative changes.
Diffuse osteopenia.
IMPRESSION: No acute abnormality. Stable mild chronic interstitial lung disease.

## 2016-01-07 DIAGNOSIS — L723 Sebaceous cyst: Secondary | ICD-10-CM | POA: Diagnosis not present

## 2016-01-07 DIAGNOSIS — C4442 Squamous cell carcinoma of skin of scalp and neck: Secondary | ICD-10-CM | POA: Diagnosis not present

## 2016-01-07 DIAGNOSIS — D485 Neoplasm of uncertain behavior of skin: Secondary | ICD-10-CM | POA: Diagnosis not present

## 2016-02-18 DIAGNOSIS — C44222 Squamous cell carcinoma of skin of right ear and external auricular canal: Secondary | ICD-10-CM | POA: Diagnosis not present

## 2016-03-04 DIAGNOSIS — Z48298 Encounter for aftercare following other organ transplant: Secondary | ICD-10-CM | POA: Diagnosis not present

## 2016-03-04 DIAGNOSIS — Z941 Heart transplant status: Secondary | ICD-10-CM | POA: Diagnosis not present

## 2016-03-04 DIAGNOSIS — Z79899 Other long term (current) drug therapy: Secondary | ICD-10-CM | POA: Diagnosis not present

## 2016-03-04 DIAGNOSIS — Z23 Encounter for immunization: Secondary | ICD-10-CM | POA: Diagnosis not present

## 2016-04-26 IMAGING — DX DG CHEST 2V
2 series · 2 of 2 positions shown · non-contrast
Comparison: None.

CLINICAL DATA: PATIENT STATES " HAVING WEAKNESS, TIRED, CAN'T WALK,
STAGGERING, SYMPTOMS BECAME MORE WORSE THAN NORMAL LAST NIGHT"
HISTORY OF STROKE, 3 STROKES,

EXAM:
CHEST  2 VIEW

[chest lat]
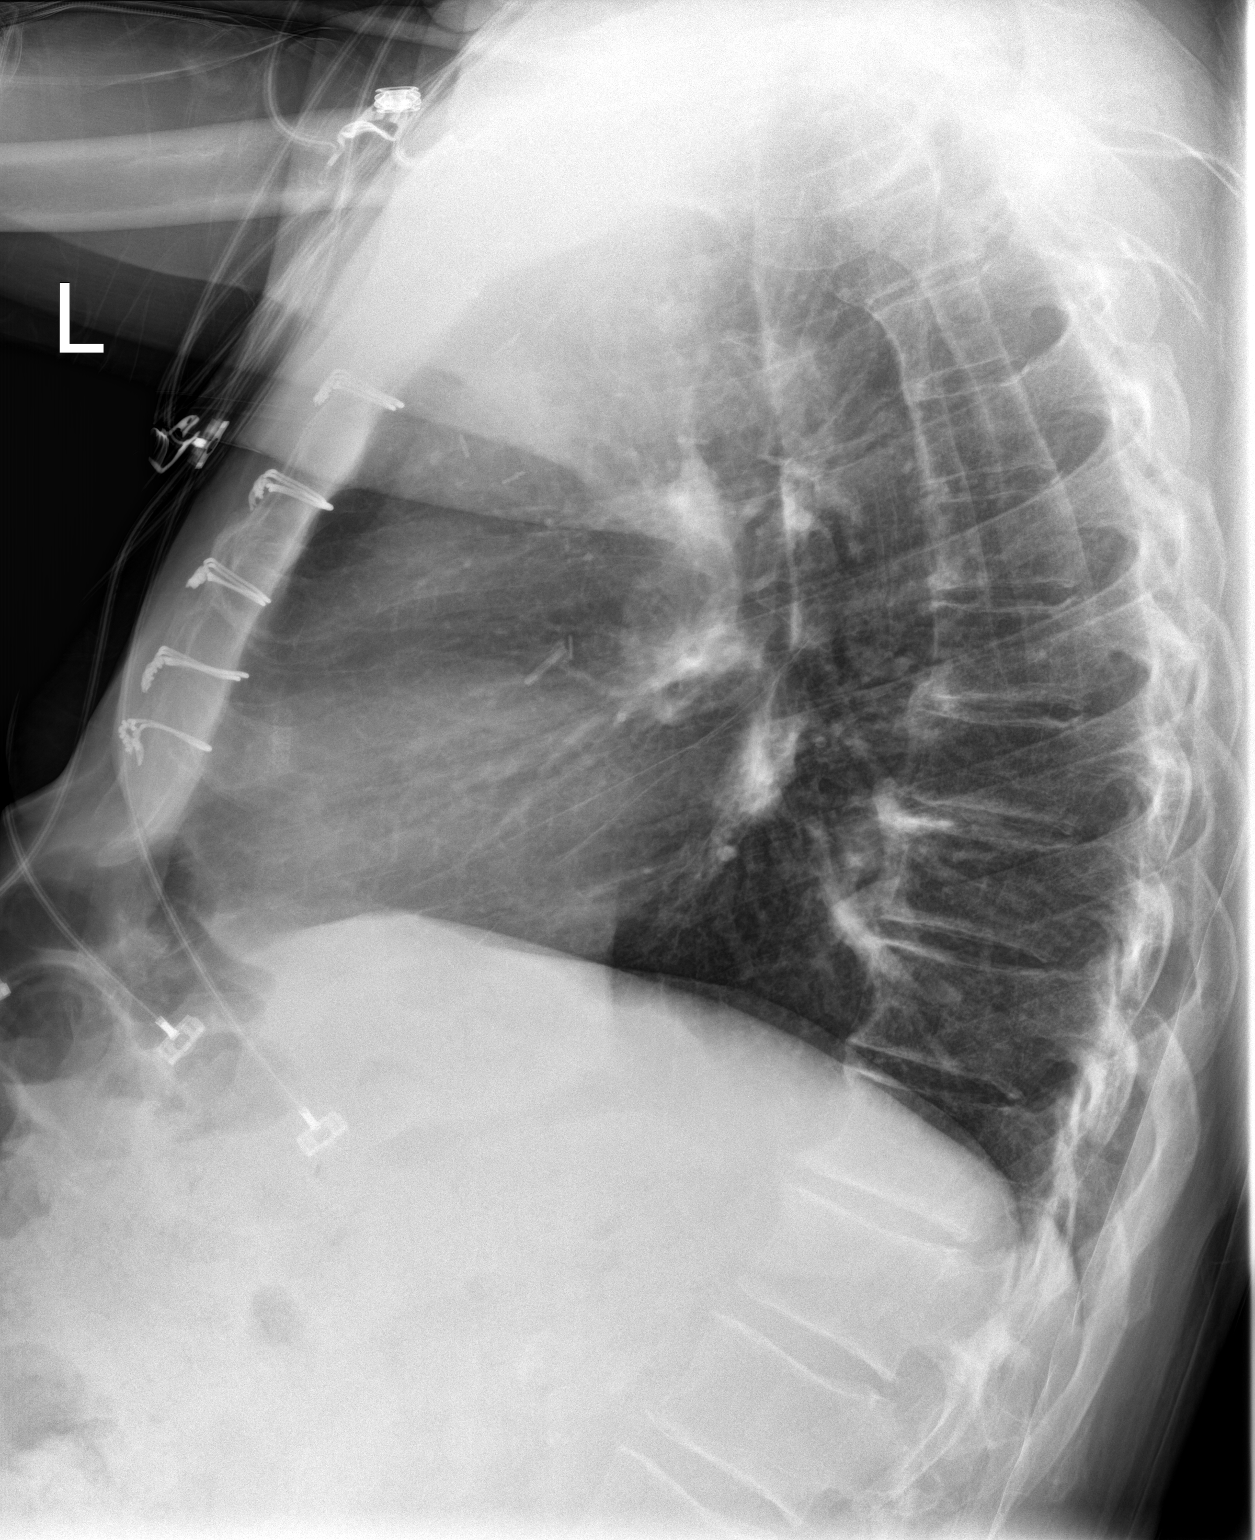

[chest ap]
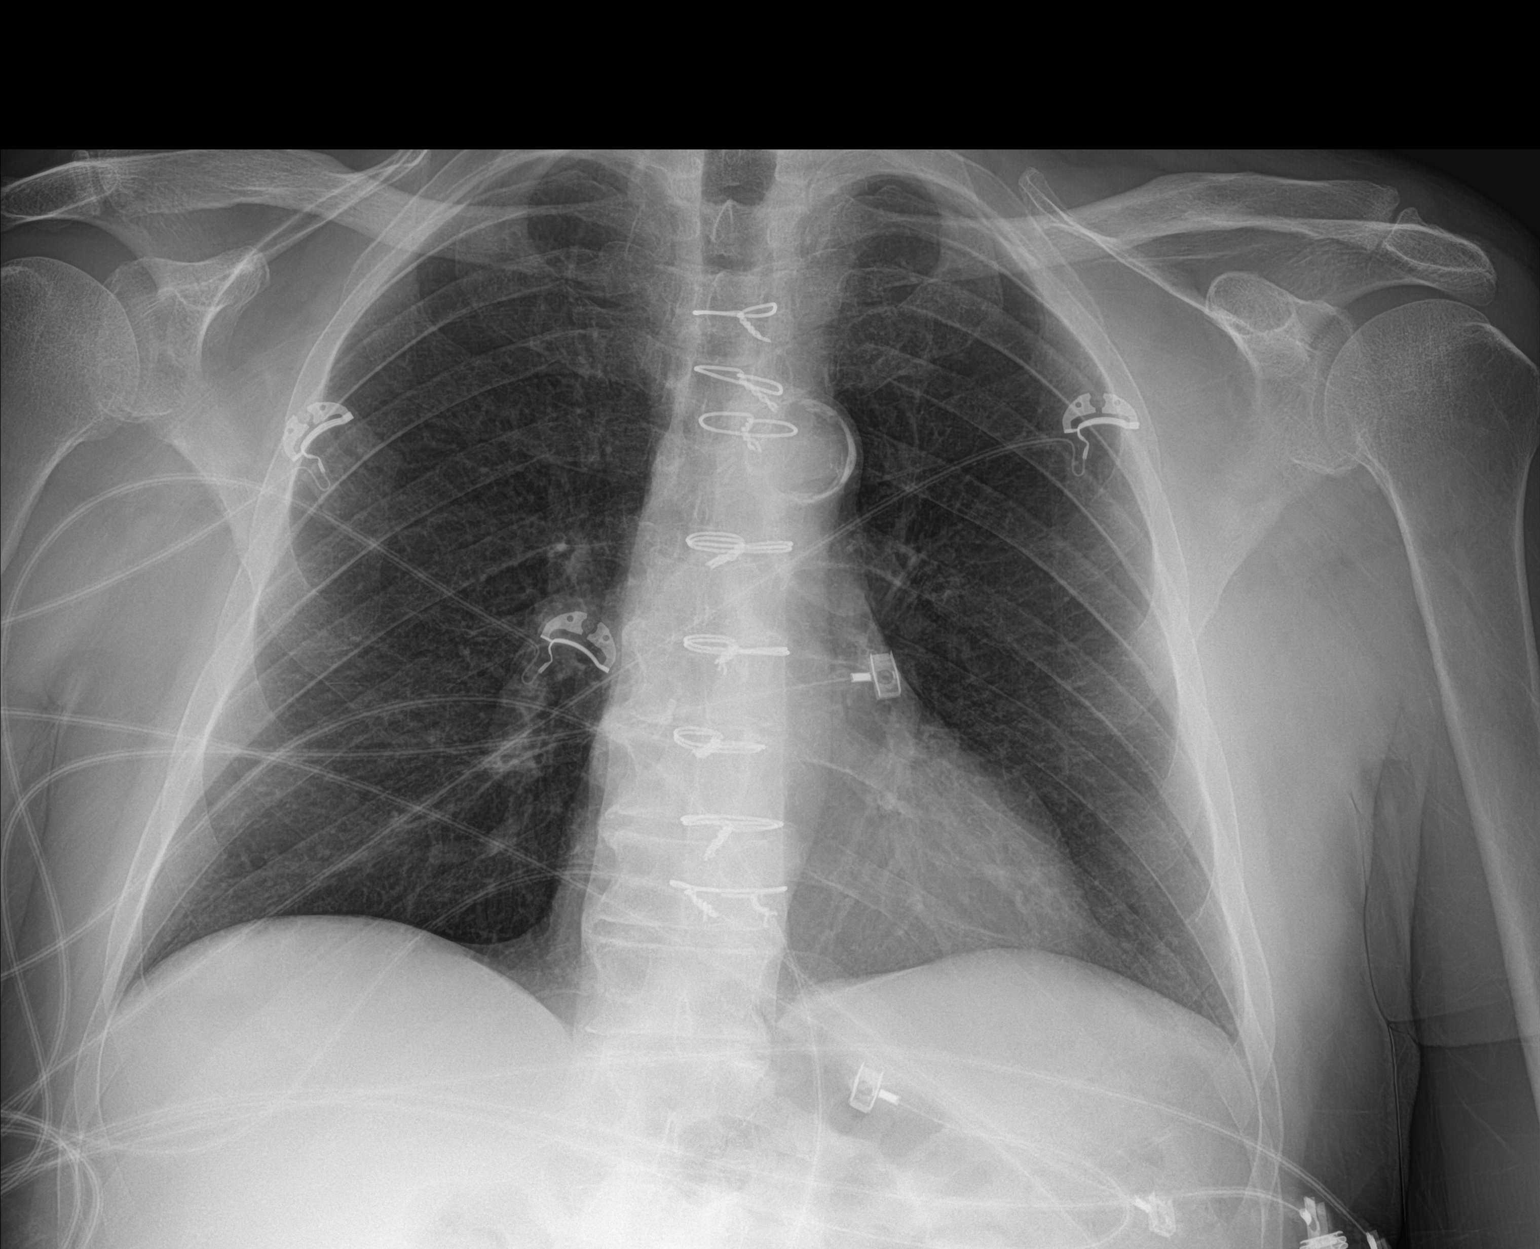

[2 of 2 positions shown; findings below may reference images not displayed]

FINDINGS: Sternotomy wires overlie normal cardiac silhouette. No effusion,
infiltrate, or pneumothorax. Lungs are hyperinflated.
IMPRESSION: Hyperinflated lungs.  No acute findings.

## 2016-06-01 DIAGNOSIS — Z6826 Body mass index (BMI) 26.0-26.9, adult: Secondary | ICD-10-CM | POA: Diagnosis not present

## 2016-06-01 DIAGNOSIS — L723 Sebaceous cyst: Secondary | ICD-10-CM | POA: Diagnosis not present

## 2016-06-17 DIAGNOSIS — L723 Sebaceous cyst: Secondary | ICD-10-CM | POA: Diagnosis not present

## 2016-10-06 DIAGNOSIS — H2513 Age-related nuclear cataract, bilateral: Secondary | ICD-10-CM | POA: Diagnosis not present

## 2016-10-06 DIAGNOSIS — H524 Presbyopia: Secondary | ICD-10-CM | POA: Diagnosis not present

## 2016-10-06 DIAGNOSIS — H25013 Cortical age-related cataract, bilateral: Secondary | ICD-10-CM | POA: Diagnosis not present

## 2016-10-06 DIAGNOSIS — H52203 Unspecified astigmatism, bilateral: Secondary | ICD-10-CM | POA: Diagnosis not present

## 2016-10-19 DIAGNOSIS — T8621 Heart transplant rejection: Secondary | ICD-10-CM | POA: Diagnosis not present

## 2016-10-19 DIAGNOSIS — Z7982 Long term (current) use of aspirin: Secondary | ICD-10-CM | POA: Diagnosis not present

## 2016-10-19 DIAGNOSIS — Z48298 Encounter for aftercare following other organ transplant: Secondary | ICD-10-CM | POA: Diagnosis not present

## 2016-10-19 DIAGNOSIS — Z79899 Other long term (current) drug therapy: Secondary | ICD-10-CM | POA: Diagnosis not present

## 2016-10-19 DIAGNOSIS — Z4821 Encounter for aftercare following heart transplant: Secondary | ICD-10-CM | POA: Diagnosis not present

## 2016-10-19 DIAGNOSIS — D899 Disorder involving the immune mechanism, unspecified: Secondary | ICD-10-CM | POA: Diagnosis not present

## 2016-10-19 DIAGNOSIS — Z125 Encounter for screening for malignant neoplasm of prostate: Secondary | ICD-10-CM | POA: Diagnosis not present

## 2016-10-19 DIAGNOSIS — Z941 Heart transplant status: Secondary | ICD-10-CM | POA: Diagnosis not present

## 2016-11-18 DIAGNOSIS — H2513 Age-related nuclear cataract, bilateral: Secondary | ICD-10-CM | POA: Diagnosis not present

## 2017-03-15 DIAGNOSIS — Z23 Encounter for immunization: Secondary | ICD-10-CM | POA: Diagnosis not present

## 2017-03-16 DIAGNOSIS — I639 Cerebral infarction, unspecified: Secondary | ICD-10-CM | POA: Diagnosis not present

## 2017-03-16 DIAGNOSIS — J181 Lobar pneumonia, unspecified organism: Secondary | ICD-10-CM | POA: Diagnosis not present

## 2017-03-16 DIAGNOSIS — I63512 Cerebral infarction due to unspecified occlusion or stenosis of left middle cerebral artery: Secondary | ICD-10-CM | POA: Diagnosis present

## 2017-03-16 DIAGNOSIS — R4182 Altered mental status, unspecified: Secondary | ICD-10-CM | POA: Diagnosis not present

## 2017-03-16 DIAGNOSIS — Z66 Do not resuscitate: Secondary | ICD-10-CM | POA: Diagnosis present

## 2017-03-16 DIAGNOSIS — I69992 Facial weakness following unspecified cerebrovascular disease: Secondary | ICD-10-CM | POA: Diagnosis not present

## 2017-03-16 DIAGNOSIS — R410 Disorientation, unspecified: Secondary | ICD-10-CM | POA: Diagnosis not present

## 2017-03-16 DIAGNOSIS — D649 Anemia, unspecified: Secondary | ICD-10-CM | POA: Diagnosis not present

## 2017-03-16 DIAGNOSIS — I4891 Unspecified atrial fibrillation: Secondary | ICD-10-CM | POA: Diagnosis not present

## 2017-03-16 DIAGNOSIS — I480 Paroxysmal atrial fibrillation: Secondary | ICD-10-CM | POA: Diagnosis present

## 2017-03-16 DIAGNOSIS — D899 Disorder involving the immune mechanism, unspecified: Secondary | ICD-10-CM | POA: Diagnosis not present

## 2017-03-16 DIAGNOSIS — G936 Cerebral edema: Secondary | ICD-10-CM | POA: Diagnosis not present

## 2017-03-16 DIAGNOSIS — J9811 Atelectasis: Secondary | ICD-10-CM | POA: Diagnosis not present

## 2017-03-16 DIAGNOSIS — I255 Ischemic cardiomyopathy: Secondary | ICD-10-CM | POA: Diagnosis present

## 2017-03-16 DIAGNOSIS — R0602 Shortness of breath: Secondary | ICD-10-CM | POA: Diagnosis not present

## 2017-03-16 DIAGNOSIS — I471 Supraventricular tachycardia: Secondary | ICD-10-CM | POA: Diagnosis not present

## 2017-03-16 DIAGNOSIS — I129 Hypertensive chronic kidney disease with stage 1 through stage 4 chronic kidney disease, or unspecified chronic kidney disease: Secondary | ICD-10-CM | POA: Diagnosis present

## 2017-03-16 DIAGNOSIS — Z452 Encounter for adjustment and management of vascular access device: Secondary | ICD-10-CM | POA: Diagnosis not present

## 2017-03-16 DIAGNOSIS — I6602 Occlusion and stenosis of left middle cerebral artery: Secondary | ICD-10-CM | POA: Diagnosis not present

## 2017-03-16 DIAGNOSIS — I481 Persistent atrial fibrillation: Secondary | ICD-10-CM | POA: Diagnosis not present

## 2017-03-16 DIAGNOSIS — D72829 Elevated white blood cell count, unspecified: Secondary | ICD-10-CM | POA: Diagnosis present

## 2017-03-16 DIAGNOSIS — I82611 Acute embolism and thrombosis of superficial veins of right upper extremity: Secondary | ICD-10-CM | POA: Diagnosis not present

## 2017-03-16 DIAGNOSIS — I6523 Occlusion and stenosis of bilateral carotid arteries: Secondary | ICD-10-CM | POA: Diagnosis not present

## 2017-03-16 DIAGNOSIS — G9389 Other specified disorders of brain: Secondary | ICD-10-CM | POA: Diagnosis not present

## 2017-03-16 DIAGNOSIS — I517 Cardiomegaly: Secondary | ICD-10-CM | POA: Diagnosis not present

## 2017-03-16 DIAGNOSIS — R1312 Dysphagia, oropharyngeal phase: Secondary | ICD-10-CM | POA: Diagnosis present

## 2017-03-16 DIAGNOSIS — M109 Gout, unspecified: Secondary | ICD-10-CM | POA: Diagnosis present

## 2017-03-16 DIAGNOSIS — R402422 Glasgow coma scale score 9-12, at arrival to emergency department: Secondary | ICD-10-CM | POA: Diagnosis not present

## 2017-03-16 DIAGNOSIS — N183 Chronic kidney disease, stage 3 (moderate): Secondary | ICD-10-CM | POA: Diagnosis present

## 2017-03-16 DIAGNOSIS — I4892 Unspecified atrial flutter: Secondary | ICD-10-CM | POA: Diagnosis not present

## 2017-03-16 DIAGNOSIS — R0989 Other specified symptoms and signs involving the circulatory and respiratory systems: Secondary | ICD-10-CM | POA: Diagnosis not present

## 2017-03-16 DIAGNOSIS — F329 Major depressive disorder, single episode, unspecified: Secondary | ICD-10-CM | POA: Diagnosis present

## 2017-03-16 DIAGNOSIS — J690 Pneumonitis due to inhalation of food and vomit: Secondary | ICD-10-CM | POA: Diagnosis present

## 2017-03-16 DIAGNOSIS — I252 Old myocardial infarction: Secondary | ICD-10-CM | POA: Diagnosis not present

## 2017-03-16 DIAGNOSIS — N179 Acute kidney failure, unspecified: Secondary | ICD-10-CM | POA: Diagnosis present

## 2017-03-16 DIAGNOSIS — I63412 Cerebral infarction due to embolism of left middle cerebral artery: Secondary | ICD-10-CM | POA: Diagnosis not present

## 2017-03-16 DIAGNOSIS — I6789 Other cerebrovascular disease: Secondary | ICD-10-CM | POA: Diagnosis not present

## 2017-03-16 DIAGNOSIS — Z941 Heart transplant status: Secondary | ICD-10-CM | POA: Diagnosis not present

## 2017-03-16 DIAGNOSIS — Z515 Encounter for palliative care: Secondary | ICD-10-CM | POA: Diagnosis present

## 2017-03-16 DIAGNOSIS — G935 Compression of brain: Secondary | ICD-10-CM | POA: Diagnosis present

## 2017-03-16 DIAGNOSIS — Z743 Need for continuous supervision: Secondary | ICD-10-CM | POA: Diagnosis not present

## 2017-03-16 DIAGNOSIS — Z87891 Personal history of nicotine dependence: Secondary | ICD-10-CM | POA: Diagnosis not present

## 2017-03-16 DIAGNOSIS — I611 Nontraumatic intracerebral hemorrhage in hemisphere, cortical: Secondary | ICD-10-CM | POA: Diagnosis not present

## 2017-03-16 DIAGNOSIS — I700 Atherosclerosis of aorta: Secondary | ICD-10-CM | POA: Diagnosis not present

## 2017-03-16 DIAGNOSIS — R0603 Acute respiratory distress: Secondary | ICD-10-CM | POA: Diagnosis not present

## 2017-03-16 DIAGNOSIS — J811 Chronic pulmonary edema: Secondary | ICD-10-CM | POA: Diagnosis not present

## 2017-03-16 DIAGNOSIS — I69351 Hemiplegia and hemiparesis following cerebral infarction affecting right dominant side: Secondary | ICD-10-CM | POA: Diagnosis not present

## 2017-03-16 DIAGNOSIS — I63522 Cerebral infarction due to unspecified occlusion or stenosis of left anterior cerebral artery: Secondary | ICD-10-CM | POA: Diagnosis not present

## 2017-03-16 DIAGNOSIS — J9601 Acute respiratory failure with hypoxia: Secondary | ICD-10-CM | POA: Diagnosis present

## 2017-03-16 DIAGNOSIS — R1310 Dysphagia, unspecified: Secondary | ICD-10-CM | POA: Diagnosis not present

## 2017-03-16 DIAGNOSIS — R509 Fever, unspecified: Secondary | ICD-10-CM | POA: Diagnosis not present

## 2017-03-16 DIAGNOSIS — I614 Nontraumatic intracerebral hemorrhage in cerebellum: Secondary | ICD-10-CM | POA: Diagnosis present

## 2017-03-16 DIAGNOSIS — R29724 NIHSS score 24: Secondary | ICD-10-CM | POA: Diagnosis not present

## 2017-03-16 DIAGNOSIS — I610 Nontraumatic intracerebral hemorrhage in hemisphere, subcortical: Secondary | ICD-10-CM | POA: Diagnosis not present

## 2017-03-16 DIAGNOSIS — G8101 Flaccid hemiplegia affecting right dominant side: Secondary | ICD-10-CM | POA: Diagnosis present

## 2017-03-16 DIAGNOSIS — R112 Nausea with vomiting, unspecified: Secondary | ICD-10-CM | POA: Diagnosis not present

## 2017-03-16 DIAGNOSIS — R4701 Aphasia: Secondary | ICD-10-CM | POA: Diagnosis not present

## 2017-03-16 DIAGNOSIS — I484 Atypical atrial flutter: Secondary | ICD-10-CM | POA: Diagnosis not present

## 2017-03-16 DIAGNOSIS — R918 Other nonspecific abnormal finding of lung field: Secondary | ICD-10-CM | POA: Diagnosis not present

## 2017-03-16 DIAGNOSIS — R29810 Facial weakness: Secondary | ICD-10-CM | POA: Diagnosis present

## 2017-03-16 DIAGNOSIS — Z9282 Status post administration of tPA (rtPA) in a different facility within the last 24 hours prior to admission to current facility: Secondary | ICD-10-CM | POA: Diagnosis not present

## 2017-03-16 DIAGNOSIS — J984 Other disorders of lung: Secondary | ICD-10-CM | POA: Diagnosis not present

## 2017-03-16 DIAGNOSIS — Z4682 Encounter for fitting and adjustment of non-vascular catheter: Secondary | ICD-10-CM | POA: Diagnosis not present

## 2017-03-16 DIAGNOSIS — I081 Rheumatic disorders of both mitral and tricuspid valves: Secondary | ICD-10-CM | POA: Diagnosis not present

## 2017-03-16 DIAGNOSIS — I340 Nonrheumatic mitral (valve) insufficiency: Secondary | ICD-10-CM | POA: Diagnosis not present

## 2017-03-16 DIAGNOSIS — R29726 NIHSS score 26: Secondary | ICD-10-CM | POA: Diagnosis present

## 2017-03-16 DIAGNOSIS — J90 Pleural effusion, not elsewhere classified: Secondary | ICD-10-CM | POA: Diagnosis not present

## 2017-03-16 DIAGNOSIS — R05 Cough: Secondary | ICD-10-CM | POA: Diagnosis not present

## 2017-03-16 DIAGNOSIS — E785 Hyperlipidemia, unspecified: Secondary | ICD-10-CM | POA: Diagnosis not present

## 2017-03-16 DIAGNOSIS — R29818 Other symptoms and signs involving the nervous system: Secondary | ICD-10-CM | POA: Diagnosis not present

## 2017-03-16 DIAGNOSIS — I10 Essential (primary) hypertension: Secondary | ICD-10-CM | POA: Diagnosis not present

## 2017-04-05 ENCOUNTER — Encounter: Payer: Self-pay | Admitting: Physical Therapy

## 2017-04-05 NOTE — Therapy (Signed)
[facility] [address] [place], [place], [address] Phone: [phone]   Fax:  [phone]  Patient Details  Name: Adam Waters MRN: [id number] Date of Birth: 07/10/1945 Referring Provider:  No ref. provider found  Encounter Date: 04/05/2017  PHYSICAL THERAPY DISCHARGE SUMMARY  Visits from Start of Care: 6 (last visit 08/15/14)  Current functional level related to goals / functional outcomes: PT Long Term Goals - 07/15/14 0902      PT LONG TERM GOAL #1   Title  Pt will be independent with HEP for improved strength, balance, gait. (Target 08/10/14)    Time  4    Period  Weeks    Status  On-going      PT LONG TERM GOAL #2   Title  Pt will improve Timed Up and Go score to less than or equal to 13.5 seconds for decreased fall risk.    Time  4    Period  Weeks    Status  On-going      PT LONG TERM GOAL #3   Title  Pt will improve Dynamic Gait Index score to at least 19/24 for decreased fall risk.    Time  4    Period  Weeks    Status  On-going      PT LONG TERM GOAL #4   Title  Pt will verbalize understanding of fall prevention within the home environment.    Time  4    Period  Weeks    Status  On-going      PT LONG TERM GOAL #5   Title  Pt will demonstrate understanding of use of cane with gait, as evidenced by gait >500 ft on indoor/outdoor surfaces with cane, modified independently with no LOB.    Time  4    Period  Weeks    Status  New         Remaining deficits: Gait, balance-discussed continuing PT, but pt did not return after 08/15/14 visit   Education / Equipment: HEP  Plan: Patient agrees to discharge.  Patient goals were not met. Patient is being discharged due to not returning since the last visit.  ?????       Yoland Scherr W. 04/05/2017, 11:31 AM  Mady Haagensen, PT 04/05/17 11:33 AM Phone: [phone] Fax: [facility] [place] [address] [place], [place], [address] Phone: [phone]   Fax:  [phone]

## 2017-04-26 DEATH — deceased
